# Patient Record
Sex: Male | Born: 1990 | Race: Black or African American | Hispanic: No | Marital: Married | Smoking: Former smoker
Health system: Southern US, Community
[De-identification: ages and names within clinical notes are randomized; demographics above are authoritative.]

## PROBLEM LIST (undated history)

## (undated) DIAGNOSIS — I10 Essential (primary) hypertension: Secondary | ICD-10-CM

## (undated) DIAGNOSIS — F1010 Alcohol abuse, uncomplicated: Secondary | ICD-10-CM

## (undated) HISTORY — PX: INGUINAL HERNIA REPAIR: SUR1180

---
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
## 2010-01-15 ENCOUNTER — Emergency Department (HOSPITAL_COMMUNITY): Admission: EM | Admit: 2010-01-15 | Discharge: 2010-01-15 | Payer: Self-pay | Admitting: Emergency Medicine

## 2010-05-10 ENCOUNTER — Emergency Department (HOSPITAL_COMMUNITY): Admission: EM | Admit: 2010-05-10 | Discharge: 2010-05-10 | Payer: Self-pay | Admitting: Emergency Medicine

## 2010-10-05 ENCOUNTER — Emergency Department (HOSPITAL_COMMUNITY)
Admission: EM | Admit: 2010-10-05 | Discharge: 2010-10-05 | Payer: Self-pay | Source: Home / Self Care | Admitting: Emergency Medicine

## 2010-10-09 LAB — GC/CHLAMYDIA PROBE AMP, GENITAL
Chlamydia, DNA Probe: NEGATIVE
GC Probe Amp, Genital: NEGATIVE

## 2011-08-15 ENCOUNTER — Emergency Department (HOSPITAL_COMMUNITY)
Admission: EM | Admit: 2011-08-15 | Discharge: 2011-08-15 | Disposition: A | Discharge status: Home / Self Care | Payer: BC Managed Care – PPO | Attending: Emergency Medicine | Admitting: Emergency Medicine

## 2011-08-15 ENCOUNTER — Encounter: Payer: Self-pay | Admitting: *Deleted

## 2011-08-15 DIAGNOSIS — H00039 Abscess of eyelid unspecified eye, unspecified eyelid: Secondary | ICD-10-CM | POA: Insufficient documentation

## 2011-08-15 DIAGNOSIS — F17200 Nicotine dependence, unspecified, uncomplicated: Secondary | ICD-10-CM | POA: Insufficient documentation

## 2011-08-15 DIAGNOSIS — L03213 Periorbital cellulitis: Secondary | ICD-10-CM

## 2011-08-15 DIAGNOSIS — H109 Unspecified conjunctivitis: Secondary | ICD-10-CM | POA: Insufficient documentation

## 2011-08-15 MED ORDER — AMOXICILLIN-POT CLAVULANATE 500-125 MG PO TABS: ORAL_TABLET | ORAL | Status: DC

## 2011-08-15 MED ORDER — GATIFLOXACIN 0.5 % OP SOLN
1.0000 [drp] | Freq: Four times a day (QID) | OPHTHALMIC | Status: DC
  Administered 2011-08-15: 1 [drp] via OPHTHALMIC
  Filled 2011-08-15 (×2): qty 2.5

## 2011-08-15 MED ORDER — DIPHENHYDRAMINE HCL 25 MG PO CAPS
25.0000 mg | ORAL_CAPSULE | Freq: Once | ORAL | Status: AC
  Administered 2011-08-15: 25 mg via ORAL
  Filled 2011-08-15: qty 1

## 2011-08-15 NOTE — ED Notes (Signed)
MD at bedside. 

## 2011-08-15 NOTE — ED Notes (Signed)
Right eye swollen, very red with drainage; family member states that pt dx with pink eye and has taking eye drops for it but eye has gotten worse since taking eye drops, pt seen Dr. Janna Arch this morning and was sent here for evaluation, pt c/o irritation and itching

## 2011-08-15 NOTE — ED Provider Notes (Signed)
History     CSN: [id number] Arrival date & time: 08/15/2011 11:06 AM   First MD Initiated Contact with Patient 08/15/11 1125      Chief Complaint  Patient presents with  . Conjunctivitis    (Consider location/radiation/quality/duration/timing/severity/associated sxs/prior treatment) HPI Comments: Patient c/o right eye pain, drainage and redness for 4 days.  Eye is "matted" shut in the mornings.  States he woke up yesterday and also noticed swelling of his right upper eyelid.  He was seen by his PMD earlier this week for same and given sulfa drops that he has been using as directed but states the symptoms seem to be worsening.  He denies facial pain, headache, decreased vision, fever or vomiting  Patient is a 20 y.o. male presenting with conjunctivitis. The history is provided by the patient and a parent.  Conjunctivitis  The current episode started 3 to 5 days ago. The onset was gradual. The problem occurs continuously. The problem has been gradually worsening. The problem is moderate. The symptoms are relieved by nothing. The symptoms are aggravated by light. Associated symptoms include eye itching, photophobia, congestion, rhinorrhea, swollen glands, eye discharge, eye pain and eye redness. Pertinent negatives include no orthopnea, no fever, no decreased vision, no vomiting, no ear discharge, no ear pain, no headaches, no hearing loss, no mouth sores, no sore throat, no stridor, no muscle aches, no neck pain, no neck stiffness, no cough, no URI, no wheezing and no rash. The eye pain is mild. There is pain in the right eye. The eye pain is not associated with movement. The eyelid exhibits swelling and redness. He has been behaving normally. He has been eating and drinking normally. There were no sick contacts. Recently, medical care has been given by the PCP.    History reviewed. No pertinent past medical history.  History reviewed. No pertinent past surgical history.  History reviewed. No  pertinent family history.  History  Substance Use Topics  . Smoking status: Current Some Day Smoker  . Smokeless tobacco: Not on file  . Alcohol Use: Yes     occasionally      Review of Systems  Constitutional: Negative for fever, chills and fatigue.  HENT: Positive for congestion and rhinorrhea. Negative for hearing loss, ear pain, sore throat, facial swelling, mouth sores, trouble swallowing, neck pain, neck stiffness, sinus pressure and ear discharge.   Eyes: Positive for photophobia, pain, discharge, redness and itching.  Respiratory: Negative for cough, shortness of breath, wheezing and stridor.   Cardiovascular: Positive for palpitations. Negative for chest pain and orthopnea.  Gastrointestinal: Negative for vomiting.  Genitourinary: Negative for dysuria, hematuria and flank pain.  Musculoskeletal: Negative for myalgias, back pain and arthralgias.  Skin: Negative for rash.  Neurological: Negative for dizziness, weakness, numbness and headaches.  Hematological: Positive for adenopathy. Does not bruise/bleed easily.    Allergies  Review of patient's allergies indicates no known allergies.  Home Medications  No current outpatient prescriptions on file.  BP 129/79  Pulse 65  Temp(Src) 97.5 F (36.4 C) (Oral)  Resp 18  Ht 5\' 8"  (1.727 m)  Wt 157 lb (71.215 kg)  BMI 23.87 kg/m2  SpO2 98%  Physical Exam  Nursing note and vitals reviewed. Constitutional: He is oriented to person, place, and time. He appears well-developed and well-nourished. No distress.  HENT:  Head: Normocephalic and atraumatic.  Mouth/Throat: Oropharynx is clear and moist.  Eyes: EOM are normal. Pupils are equal, round, and reactive to light. Right eye exhibits discharge  and exudate. Right eye exhibits no chemosis and no hordeolum. No foreign body present in the right eye. Left eye exhibits no chemosis, no discharge and no exudate. Right conjunctiva is injected. Right conjunctiva has no hemorrhage. No  scleral icterus. Right pupil is round and reactive. Pupils are equal.       Moderate STS of the upper right eyelid.  Mild erythema of the upper eyelid only.  No tenderness, no ptosis  Neck: Normal range of motion. Neck supple. No Brudzinski's sign and no Kernig's sign noted.  Cardiovascular: Normal rate, regular rhythm and normal heart sounds.   Pulmonary/Chest: Effort normal and breath sounds normal. No respiratory distress. He exhibits no tenderness.  Musculoskeletal: Normal range of motion. He exhibits no tenderness.  Lymphadenopathy:       Head (right side): Preauricular adenopathy present.    He has no cervical adenopathy.  Neurological: He is alert and oriented to person, place, and time. He displays normal reflexes. No cranial nerve deficit. He exhibits normal muscle tone. Coordination normal.  Skin: Skin is warm and dry.  Psychiatric: He has a normal mood and affect.    ED Course  Procedures (including critical care time)       MDM     1:30 PM patient remains stable.  Edema of the upper eyelid has improved during ED stay.  No increasing erythema.  Pt is non-toxic appearing.  Will have him d/c the sulfa eye drops and continue the gatifloxin and will also prescribe augmentin for possible early periorbital cellulitis.  Pt agrees to return here if sx's worsen.   Pt feels improved after observation and/or treatment in ED.   Patient / Family / Caregiver understand and agree with initial ED impression and plan with expectations set for ED visit.   Ahsan Esterline L. [place], [place] 08/17/11 2034

## 2011-08-15 NOTE — ED Notes (Signed)
Pt also wears contact lenses but has not used them Sunday in his right eye.

## 2011-08-15 NOTE — ED Notes (Addendum)
Pt c/o swelling, redness and drainage to his right eye since Sunday. Denies injury. Pt was seen by PCP on Monday and given eye drops. Pt states that it has gotten worse.

## 2011-08-18 NOTE — ED Provider Notes (Signed)
Medical screening examination/treatment/procedure(s) were performed by non-physician practitioner and as supervising physician I was immediately available for consultation/collaboration.   Dayton Bailiff, MD 08/18/11 [phone]

## 2012-04-29 ENCOUNTER — Encounter (HOSPITAL_COMMUNITY): Payer: Self-pay | Admitting: Emergency Medicine

## 2012-04-29 ENCOUNTER — Emergency Department (HOSPITAL_COMMUNITY)
Admission: EM | Admit: 2012-04-29 | Discharge: 2012-04-29 | Disposition: A | Discharge status: Home / Self Care | Payer: No Typology Code available for payment source | Attending: Emergency Medicine | Admitting: Emergency Medicine

## 2012-04-29 ENCOUNTER — Emergency Department (HOSPITAL_COMMUNITY): Payer: No Typology Code available for payment source

## 2012-04-29 DIAGNOSIS — R079 Chest pain, unspecified: Secondary | ICD-10-CM | POA: Insufficient documentation

## 2012-04-29 DIAGNOSIS — R51 Headache: Secondary | ICD-10-CM | POA: Insufficient documentation

## 2012-04-29 DIAGNOSIS — M545 Low back pain, unspecified: Secondary | ICD-10-CM | POA: Insufficient documentation

## 2012-04-29 DIAGNOSIS — F17200 Nicotine dependence, unspecified, uncomplicated: Secondary | ICD-10-CM | POA: Insufficient documentation

## 2012-04-29 MED ORDER — DIAZEPAM 5 MG PO TABS: 5.0000 mg | ORAL_TABLET | Freq: Two times a day (BID) | ORAL | Status: AC

## 2012-04-29 MED ORDER — IBUPROFEN 400 MG PO TABS
800.0000 mg | ORAL_TABLET | Freq: Once | ORAL | Status: AC
  Administered 2012-04-29: 800 mg via ORAL
  Filled 2012-04-29: qty 2

## 2012-04-29 MED ORDER — IBUPROFEN 800 MG PO TABS: 800.0000 mg | ORAL_TABLET | Freq: Three times a day (TID) | ORAL | Status: AC
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
## 2012-04-29 NOTE — ED Notes (Signed)
Pt d/c home in NAD. Pt able to ambulate with quick steady gait. Pt voiced understanding of d/c instructions and follow up care. Instructed not to drive after taking valium .
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
## 2012-05-02 NOTE — ED Provider Notes (Signed)
Medical screening examination/treatment/procedure(s) were performed by non-physician practitioner and as supervising physician I was immediately available for consultation/collaboration.   Charles B. Sheldon, MD 05/02/12 2152 

## 2012-07-10 ENCOUNTER — Encounter (HOSPITAL_COMMUNITY): Payer: Self-pay

## 2012-07-10 ENCOUNTER — Emergency Department (HOSPITAL_COMMUNITY): Payer: BC Managed Care – PPO

## 2012-07-10 ENCOUNTER — Emergency Department (HOSPITAL_COMMUNITY)
Admission: EM | Admit: 2012-07-10 | Discharge: 2012-07-10 | Disposition: A | Discharge status: Home / Self Care | Payer: BC Managed Care – PPO | Attending: Emergency Medicine | Admitting: Emergency Medicine

## 2012-07-10 DIAGNOSIS — Y929 Unspecified place or not applicable: Secondary | ICD-10-CM | POA: Insufficient documentation

## 2012-07-10 DIAGNOSIS — S61411A Laceration without foreign body of right hand, initial encounter: Secondary | ICD-10-CM

## 2012-07-10 DIAGNOSIS — S61409A Unspecified open wound of unspecified hand, initial encounter: Secondary | ICD-10-CM | POA: Insufficient documentation

## 2012-07-10 DIAGNOSIS — W230XXA Caught, crushed, jammed, or pinched between moving objects, initial encounter: Secondary | ICD-10-CM | POA: Insufficient documentation

## 2012-07-10 DIAGNOSIS — Y939 Activity, unspecified: Secondary | ICD-10-CM | POA: Insufficient documentation

## 2012-07-10 MED ORDER — CEPHALEXIN 500 MG PO CAPS: 500.0000 mg | ORAL_CAPSULE | Freq: Four times a day (QID) | ORAL | Status: DC

## 2012-07-10 MED ORDER — LIDOCAINE HCL (PF) 2 % IJ SOLN
2.0000 mL | Freq: Once | INTRAMUSCULAR | Status: AC
  Administered 2012-07-10: 2 mL
  Filled 2012-07-10: qty 10

## 2012-07-10 MED ORDER — HYDROCODONE-ACETAMINOPHEN 5-325 MG PO TABS: 1.0000 | ORAL_TABLET | ORAL | Status: AC | PRN

## 2012-07-10 NOTE — ED Provider Notes (Signed)
History     CSN: [id number]  Arrival date & time 07/10/12  1433   First MD Initiated Contact with Patient 07/10/12 1519      Chief Complaint  Patient presents with  . Hand Injury    (Consider location/radiation/quality/duration/timing/severity/associated sxs/prior treatment) HPI Comments: Jimmy Castro presents with laceration x 2 to his right dorsal hand which occurred approximately 7 hours before arrival.  He describes catching his hand in a closing sliding glass door causing 2 large flap lacerations.  He attempted to repair the injury with dressing and pressure after cleansing the wound with peroxide,  But the wound kept bleeding and wife insisted he come in for treatment.  He has moderate pain which is worse with movement of the 3rd finger and improves with rest and elevation.  He denies weakness in his hand or fingers.  He has taken no medications prior to arrival.   The history is provided by the patient.    History reviewed. No pertinent past medical history.  History reviewed. No pertinent past surgical history.  No family history on file.  History  Substance Use Topics  . Smoking status: Never Smoker   . Smokeless tobacco: Not on file  . Alcohol Use: No     occasionally      Review of Systems  Constitutional: Negative for fever and chills.  HENT: Negative for facial swelling.   Respiratory: Negative for shortness of breath and wheezing.   Skin: Positive for wound.  Neurological: Negative for numbness.    Allergies  Review of patient's allergies indicates no known allergies.  Home Medications   Current Outpatient Rx  Name Route Sig Dispense Refill  . CEPHALEXIN 500 MG PO CAPS Oral Take 1 capsule (500 mg total) by mouth 4 (four) times daily. 40 capsule 0  . HYDROCODONE-ACETAMINOPHEN 5-325 MG PO TABS Oral Take 1 tablet by mouth every 4 (four) hours as needed for pain. 20 tablet 0    BP 137/78  Pulse 77  Temp 98.1 F (36.7 C) (Oral)  Ht 5\' 8"  (1.727  m)  Wt 156 lb (70.761 kg)  BMI 23.72 kg/m2  SpO2 100%  Physical Exam  Constitutional: He is oriented to person, place, and time. He appears well-developed and well-nourished.  HENT:  Head: Normocephalic.  Cardiovascular: Normal rate.   Pulmonary/Chest: Effort normal.  Musculoskeletal: He exhibits tenderness.  Neurological: He is alert and oriented to person, place, and time. He has normal strength. No sensory deficit.       5/5 strength with resisted flexion and extension of fingers on right hand.  Skin: Laceration noted.       2 moderate sized flap lacerations at dorsal base of right third finger, subcutaneous,  Crossing 3rd mcp joint.  Distal sensation intact.  Hemostatic. Extensor tendon of third finger visualized, tendon sheath disrupted, appears abraded, tendon intact.    ED Course  Procedures (including critical care time)  Labs Reviewed - No data to display Dg Hand Complete Right  07/10/2012  *RADIOLOGY REPORT*  Clinical Data: Laceration to posterior third proximal interphalangeal joint.  RIGHT HAND - COMPLETE 3+ VIEW  Comparison: None.  Findings: Overlap of fingers on the lateral (technologist notes state the patient is unable to be appropriately positioned).  Given this factor, no acute fracture or dislocation. No radio-opaque foreign body.  No definite soft tissue swelling.  IMPRESSION: No acute osseous abnormality.  Mildly degraded lateral view.   Original Report Authenticated By: Consuello Bossier, M.D.  LACERATION REPAIR  - complicated repair with multiple flaps,  Irregular edges,  Debridement required. Performed by: Burgess Amor Authorized by: Burgess Amor Consent: Verbal consent obtained. Risks and benefits: risks, benefits and alternatives were discussed Consent given by: patient Patient identity confirmed: provided demographic data Prepped and Draped in normal sterile fashion Wound explored  Wound flushed copiously with NS after shur clens wound cleaner  applied.  Laceration Location: right hand  Laceration Length: 3 cm flap lac #1, 2 cm #2  No Foreign Bodies seen or palpated  Anesthesia: local infiltration  Local anesthetic: lidocaine 2% without epinephrine  Anesthetic total: 4 ml  Irrigation method: syringe Amount of cleaning: standard  Skin closure: 4-0 ethilon  Number of sutures: #1 - 7,  #2 - 5  Technique: simple interrupted Patient tolerance: Patient tolerated the procedure well with no immediate complications.    1. Laceration of right hand       MDM  Pt s wounds dressed,  Bulky dressing, finger splint to prevent bending at mcp.  Pt prescribed keflex given old nature of wound.  Hydrocodone prn pain.  Discussed worsened sx that should prompt return here.  Referral to Dr. Romeo Apple for a recheck in 1 week otherwise to reassess finger strength/ tendon healing.  Pt agreeable with plan.  Pt is utd on tetanus.       Burgess Amor, PA 07/10/12 2141  Burgess Amor, PA 07/10/12 2142
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
## 2012-07-10 NOTE — ED Notes (Signed)
PA repaired lac.

## 2012-07-12 NOTE — ED Provider Notes (Signed)
Medical screening examination/treatment/procedure(s) were performed by non-physician practitioner and as supervising physician I was immediately available for consultation/collaboration.   Laray Anger, DO 07/12/12 1626

## 2013-08-21 ENCOUNTER — Encounter (HOSPITAL_COMMUNITY): Payer: Self-pay | Admitting: Emergency Medicine

## 2013-08-21 ENCOUNTER — Emergency Department (HOSPITAL_COMMUNITY)
Admission: EM | Admit: 2013-08-21 | Discharge: 2013-08-21 | Disposition: A | Discharge status: Home / Self Care | Payer: BC Managed Care – PPO | Attending: Emergency Medicine | Admitting: Emergency Medicine

## 2013-08-21 DIAGNOSIS — R197 Diarrhea, unspecified: Secondary | ICD-10-CM | POA: Insufficient documentation

## 2013-08-21 DIAGNOSIS — R6889 Other general symptoms and signs: Secondary | ICD-10-CM | POA: Insufficient documentation

## 2013-08-21 DIAGNOSIS — R05 Cough: Secondary | ICD-10-CM | POA: Insufficient documentation

## 2013-08-21 DIAGNOSIS — R42 Dizziness and giddiness: Secondary | ICD-10-CM | POA: Insufficient documentation

## 2013-08-21 DIAGNOSIS — R059 Cough, unspecified: Secondary | ICD-10-CM | POA: Insufficient documentation

## 2013-08-21 DIAGNOSIS — R109 Unspecified abdominal pain: Secondary | ICD-10-CM | POA: Insufficient documentation

## 2013-08-21 DIAGNOSIS — R112 Nausea with vomiting, unspecified: Secondary | ICD-10-CM | POA: Insufficient documentation

## 2013-08-21 DIAGNOSIS — E860 Dehydration: Secondary | ICD-10-CM

## 2013-08-21 LAB — POCT I-STAT, CHEM 8
BUN: 4 mg/dL — ABNORMAL LOW (ref 6–23)
Calcium, Ion: 1.21 mmol/L (ref 1.12–1.23)
Chloride: 103 mEq/L (ref 96–112)
Creatinine, Ser: 1.1 mg/dL (ref 0.50–1.35)
Glucose, Bld: 92 mg/dL (ref 70–99)
HCT: 53 % — ABNORMAL HIGH (ref 39.0–52.0)
Hemoglobin: 18 g/dL — ABNORMAL HIGH (ref 13.0–17.0)
Potassium: 4.3 mEq/L (ref 3.5–5.1)
Sodium: 139 mEq/L (ref 135–145)
TCO2: 23 mmol/L (ref 0–100)

## 2013-08-21 MED ORDER — ONDANSETRON 4 MG PO TBDP: 4.0000 mg | ORAL_TABLET | Freq: Three times a day (TID) | ORAL | Status: DC | PRN

## 2013-08-21 MED ORDER — SODIUM CHLORIDE 0.9 % IV SOLN: 1000.0000 mL | INTRAVENOUS | Status: DC

## 2013-08-21 MED ORDER — SODIUM CHLORIDE 0.9 % IV SOLN
1000.0000 mL | Freq: Once | INTRAVENOUS | Status: AC
  Administered 2013-08-21: 1000 mL via INTRAVENOUS

## 2013-08-21 MED ORDER — ONDANSETRON HCL 4 MG/2ML IJ SOLN
4.0000 mg | Freq: Once | INTRAMUSCULAR | Status: AC
  Administered 2013-08-21: 4 mg via INTRAVENOUS
  Filled 2013-08-21: qty 2

## 2013-08-21 NOTE — ED Provider Notes (Signed)
CSN: [id number]     Arrival date & time 08/21/13  1236 History  This chart was scribed for Ward Givens, MD by Bennett Scrape, ED Scribe. This patient was seen in room [facility] and the patient's care was started at 1:15 PM.   Chief Complaint  Patient presents with  . Emesis    The history is provided by the patient. No language interpreter was used.    HPI Comments: Jimmy Castro is a 22 y.o. male who presents to the Emergency Department complaining of persistent nausea with associated non-bloody emesis, lower abdominal cramping, intermittent dizziness upon standing, nonproductive cough, sneezing  and non-bloody diarrhea described as watery that started yesterday morning. He reports 2 to 3 episodes of vomiting and 4 to 5 episodes of diarrhea since onset. He admits that he has been able to sip water but otherwise liquids and foods aggravate the symptoms. He denies any sick contacts with similar symptoms. He denies any fever, weakness or sore throat. He has mild dizziness at times.    He is an occasional smoker stating that 1 pack will last 2 weeks. He admits that he is a occasional alcohol user, last drink was 3-4 days ago.  PCP none  History reviewed. No pertinent past medical history. History reviewed. No pertinent past surgical history. No family history on file. History  Substance Use Topics  . Smoking status: Never Smoker   . Smokeless tobacco: Not on file  . Alcohol Use: No     Comment: occasionally  He is a Production designer, theatre/television/film Smokes 1 pp2 weeks occasional ETOH, but not the night before he got ill.   Review of Systems  Constitutional: Negative for fever.  HENT: Positive for sneezing. Negative for sore throat.   Respiratory: Positive for cough. Negative for shortness of breath.   Gastrointestinal: Positive for nausea, vomiting, abdominal pain and diarrhea. Negative for blood in stool.  Neurological: Positive for dizziness. Negative for weakness.  All other systems  reviewed and are negative.    Allergies  Review of patient's allergies indicates no known allergies.  Home Medications   Current Outpatient Rx  Name  Route  Sig  Dispense  Refill  . ondansetron (ZOFRAN ODT) 4 MG disintegrating tablet   Oral   Take 1 tablet (4 mg total) by mouth every 8 (eight) hours as needed for nausea or vomiting.   6 tablet   0     Triage Vitals: BP 150/99  Pulse 88  Temp(Src) 97.4 F (36.3 C) (Oral)  Resp 20  Ht 5' 7.5" (1.715 m)  Wt 148 lb (67.132 kg)  BMI 22.82 kg/m2  SpO2 100%  Vital signs normal   Orthostatic vital signs are normal   Physical Exam  Nursing note and vitals reviewed. Constitutional: He is oriented to person, place, and time. He appears well-developed and well-nourished.  Non-toxic appearance. He does not appear ill. No distress.  HENT:  Head: Normocephalic and atraumatic.  Right Ear: External ear normal.  Left Ear: External ear normal.  Nose: Nose normal. No mucosal edema or rhinorrhea.  Mouth/Throat: Oropharynx is clear and moist. No dental abscesses or uvula swelling.  Mildly dry tongue  Eyes: Conjunctivae and EOM are normal. Pupils are equal, round, and reactive to light.  Neck: Normal range of motion and full passive range of motion without pain. Neck supple.  Cardiovascular: Normal rate, regular rhythm and normal heart sounds.  Exam reveals no gallop and no friction rub.   No murmur heard. Pulmonary/Chest:  Effort normal and breath sounds normal. No respiratory distress. He has no wheezes. He has no rhonchi. He has no rales. He exhibits no tenderness and no crepitus.  Abdominal: Soft. Normal appearance. He exhibits no distension. Bowel sounds are increased. There is tenderness. There is no rebound and no guarding.  Mild discomfort in the LLQ  Musculoskeletal: Normal range of motion. He exhibits no edema and no tenderness.  Moves all extremities well.   Neurological: He is alert and oriented to person, place, and time. He  has normal strength. No cranial nerve deficit.  Skin: Skin is warm, dry and intact. No rash noted. No erythema. No pallor.  Psychiatric: He has a normal mood and affect. His speech is normal and behavior is normal. His mood appears not anxious.    ED Course  Procedures (including critical care time)  Medications  0.9 %  sodium chloride infusion (0 mLs Intravenous Stopped 08/21/13 1417)    Followed by  0.9 %  sodium chloride infusion (1,000 mLs Intravenous New Bag/Given 08/21/13 1415)    Followed by  0.9 %  sodium chloride infusion (not administered)  ondansetron (ZOFRAN) injection 4 mg (4 mg Intravenous Given 08/21/13 1329)    DIAGNOSTIC STUDIES: Oxygen Saturation is 100% on room air, normal by my interpretation.    COORDINATION OF CARE: 1:20 PM-Discussed treatment plan which includes IV fluids and antiemetic with pt at bedside and pt agreed to plan.   2:24 PM-Pt rechecked and feels improved with medications listed above. He reports that he drank fluids with no difficulty. Denies that he has to urinate currently. Reviewed blood work with pt. Discussed discharge plan when the second IV fluid is administered. Will provide antiemetic and advised pt to get imodium for diarrhea. No milk or solid foods until diarrhea is resolved. Will provide a work note for the pt for tonight.   3:01 PM-Pt rechecked and has urinated. He is feeling better. Will discharge with plan as stated above.    Results for orders placed during the hospital encounter of 08/21/13  POCT I-STAT, CHEM 8      Result Value Range   Sodium 139  135 - 145 mEq/L   Potassium 4.3  3.5 - 5.1 mEq/L   Chloride 103  96 - 112 mEq/L   BUN 4 (*) 6 - 23 mg/dL   Creatinine, Ser 1.47  0.50 - 1.35 mg/dL   Glucose, Bld 92  70 - 99 mg/dL   Calcium, Ion 8.29  5.62 - 1.23 mmol/L   TCO2 23  0 - 100 mmol/L   Hemoglobin 18.0 (*) 13.0 - 17.0 g/dL   HCT 13.0 (*) 86.5 - 78.4 %   Laboratory interpretation all normal except concentrated Hb c/w  dehydration    EKG Interpretation   None       MDM   1. Nausea vomiting and diarrhea   2. Dehydration    New Prescriptions   ONDANSETRON (ZOFRAN ODT) 4 MG DISINTEGRATING TABLET    Take 1 tablet (4 mg total) by mouth every 8 (eight) hours as needed for nausea or vomiting.    Plan discharge   Devoria Albe, MD, FACEP   I personally performed the services described in this documentation, which was scribed in my presence. The recorded information has been reviewed and considered.      Ward Givens, MD 08/21/13 [phone]

## 2013-08-21 NOTE — ED Notes (Signed)
Pt c/o n/v/d, fever that started two days ago, states that the n/v is better but continues to feel weak and dizzy.

## 2015-05-27 ENCOUNTER — Emergency Department (HOSPITAL_COMMUNITY)
Admission: EM | Admit: 2015-05-27 | Discharge: 2015-05-27 | Discharge status: Absent without leave | Payer: No Typology Code available for payment source | Attending: Emergency Medicine | Admitting: Emergency Medicine

## 2015-12-29 ENCOUNTER — Other Ambulatory Visit: Payer: Self-pay | Admitting: Occupational Medicine

## 2015-12-29 ENCOUNTER — Ambulatory Visit: Payer: Self-pay

## 2015-12-29 DIAGNOSIS — Z0000 Encounter for general adult medical examination without abnormal findings: Secondary | ICD-10-CM

## 2016-08-01 ENCOUNTER — Emergency Department (HOSPITAL_COMMUNITY)
Admission: EM | Admit: 2016-08-01 | Discharge: 2016-08-01 | Disposition: A | Discharge status: Home / Self Care | Payer: Self-pay | Attending: Emergency Medicine | Admitting: Emergency Medicine

## 2016-08-01 ENCOUNTER — Encounter (HOSPITAL_COMMUNITY): Payer: Self-pay | Admitting: Emergency Medicine

## 2016-08-01 DIAGNOSIS — I10 Essential (primary) hypertension: Secondary | ICD-10-CM | POA: Insufficient documentation

## 2016-08-01 DIAGNOSIS — F1010 Alcohol abuse, uncomplicated: Secondary | ICD-10-CM | POA: Insufficient documentation

## 2016-08-01 DIAGNOSIS — Z79899 Other long term (current) drug therapy: Secondary | ICD-10-CM | POA: Insufficient documentation

## 2016-08-01 DIAGNOSIS — F17210 Nicotine dependence, cigarettes, uncomplicated: Secondary | ICD-10-CM | POA: Insufficient documentation

## 2016-08-01 HISTORY — DX: Essential (primary) hypertension: I10

## 2016-08-01 LAB — CBC WITH DIFFERENTIAL/PLATELET
Basophils Absolute: 0 10*3/uL (ref 0.0–0.1)
Basophils Relative: 0 %
EOS ABS: 0 10*3/uL (ref 0.0–0.7)
Eosinophils Relative: 0 %
HCT: 42.7 % (ref 39.0–52.0)
HEMOGLOBIN: 16.2 g/dL (ref 13.0–17.0)
LYMPHS ABS: 1.6 10*3/uL (ref 0.7–4.0)
Lymphocytes Relative: 14 %
MCH: 35.4 pg — ABNORMAL HIGH (ref 26.0–34.0)
MCHC: 37.9 g/dL — ABNORMAL HIGH (ref 30.0–36.0)
MCV: 93.2 fL (ref 78.0–100.0)
MONO ABS: 0.7 10*3/uL (ref 0.1–1.0)
Monocytes Relative: 6 %
NEUTROS PCT: 80 %
Neutro Abs: 9.2 10*3/uL — ABNORMAL HIGH (ref 1.7–7.7)
PLATELETS: 179 10*3/uL (ref 150–400)
RBC: 4.58 MIL/uL (ref 4.22–5.81)
RDW: 14 % (ref 11.5–15.5)
WBC: 11.5 10*3/uL — AB (ref 4.0–10.5)

## 2016-08-01 LAB — COMPREHENSIVE METABOLIC PANEL
ALBUMIN: 4.8 g/dL (ref 3.5–5.0)
ALK PHOS: 145 U/L — AB (ref 38–126)
ALT: 78 U/L — AB (ref 17–63)
ANION GAP: 10 (ref 5–15)
AST: 90 U/L — ABNORMAL HIGH (ref 15–41)
BUN: 5 mg/dL — ABNORMAL LOW (ref 6–20)
CALCIUM: 9.5 mg/dL (ref 8.9–10.3)
CHLORIDE: 98 mmol/L — AB (ref 101–111)
CO2: 27 mmol/L (ref 22–32)
Creatinine, Ser: 0.69 mg/dL (ref 0.61–1.24)
GFR calc Af Amer: >60 mL/min (ref >60)
GFR calc non Af Amer: >60 mL/min (ref >60)
GLUCOSE: 90 mg/dL (ref 65–99)
Potassium: 3.2 mmol/L — ABNORMAL LOW (ref 3.5–5.1)
SODIUM: 135 mmol/L (ref 135–145)
Total Bilirubin: 1 mg/dL (ref 0.3–1.2)
Total Protein: 8.3 g/dL — ABNORMAL HIGH (ref 6.5–8.1)

## 2016-08-01 LAB — ETHANOL: Alcohol, Ethyl (B): 7 mg/dL — ABNORMAL HIGH (ref <5)

## 2016-08-01 MED ORDER — LORAZEPAM 1 MG PO TABS
1.0000 mg | ORAL_TABLET | Freq: Once | ORAL | Status: AC
  Administered 2016-08-01: 1 mg via ORAL
  Filled 2016-08-01: qty 1

## 2016-08-01 MED ORDER — CHLORDIAZEPOXIDE HCL 25 MG PO CAPS: ORAL_CAPSULE | ORAL | 0 refills | Status: DC

## 2016-08-01 NOTE — Discharge Instructions (Signed)
Follow up with [facility] tomorrow.  °

## 2016-08-01 NOTE — ED Notes (Signed)
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 Pt wants detox.  Pt denies si/hi.  Pt denies any drug use. Last drink was yesterday.   MD at bedside.

## 2016-08-01 NOTE — ED Provider Notes (Signed)
[facility]-EMERGENCY DEPT Provider Note   CSN: [id number] Arrival date & time: 08/01/16  1226     History   [id number] Complaint Chief Complaint  Patient presents with  . V70.1    HPI Jimmy Castro is a 25 y.o. male.  Patient states he wants some help for his alcohol abuse. He drinks considerable amount of clear    Alcohol Problem  This is a new problem. The current episode started 1 to 2 hours ago. The problem occurs constantly. The problem has not changed since onset.Pertinent negatives include no chest pain, no abdominal pain and no headaches. Nothing aggravates the symptoms. Nothing relieves the symptoms. He has tried nothing for the symptoms.    Past Medical History:  Diagnosis Date  . Hypertension     There are no active problems to display for this patient.   History reviewed. No pertinent surgical history.     Home Medications    Prior to Admission medications   Medication Sig Start Date End Date Taking? Authorizing Provider  guaiFENesin (MUCINEX) 600 MG 12 hr tablet Take 600 mg by mouth 2 (two) times daily as needed for to loosen phlegm.   Yes Historical Provider, MD  Omega-3 Fatty Acids (FISH OIL PO) Take 1 capsule by mouth daily.   Yes Historical Provider, MD  Pediatric Multiple Vit-C-FA (FLINSTONES GUMMIES OMEGA-3 DHA) CHEW Chew 3-4 tablets by mouth daily.   Yes Historical Provider, MD  Riboflavin (VITAMIN B-2 PO) Take 1 tablet by mouth daily.   Yes Historical Provider, MD  chlordiazePOXIDE (LIBRIUM) 25 MG capsule 50mg  PO TID x 1D, then 25-50mg  PO BID X 1D, then 25-50mg  PO QD X 1D 08/01/16   Bethann BerkshireJoseph Nickalas Mccarrick, MD  ondansetron (ZOFRAN ODT) 4 MG disintegrating tablet Take 1 tablet (4 mg total) by mouth every 8 (eight) hours as needed for nausea or vomiting. 08/21/13   Devoria AlbeIva Knapp, MD    Family History History reviewed. No pertinent family history.  Social History Social History  Substance Use Topics  . Smoking status: Current Every Day Smoker    Types: Cigarettes    . Smokeless tobacco: Never Used  . Alcohol use Yes     Comment: daily     Allergies   Patient has no known allergies.   Review of Systems Review of Systems  Constitutional: Negative for appetite change and fatigue.  HENT: Negative for congestion, ear discharge and sinus pressure.   Eyes: Negative for discharge.  Respiratory: Negative for cough.   Cardiovascular: Negative for chest pain.  Gastrointestinal: Negative for abdominal pain and diarrhea.  Genitourinary: Negative for frequency and hematuria.  Musculoskeletal: Negative for back pain.  Skin: Negative for rash.  Neurological: Negative for seizures and headaches.  Psychiatric/Behavioral: Negative for hallucinations.     Physical Exam Updated Vital Signs BP (!) 144/109   Pulse 85   Temp 97.7 F (36.5 C) (Oral)   Resp 16   Ht 5\' 7"  (1.702 m)   Wt 135 lb (61.2 kg)   SpO2 98%   BMI 21.14 kg/m   Physical Exam  Constitutional: He is oriented to person, place, and time. He appears well-developed.  HENT:  Head: Normocephalic.  Eyes: Conjunctivae and EOM are normal. No scleral icterus.  Neck: Neck supple. No thyromegaly present.  Cardiovascular: Normal rate and regular rhythm.  Exam reveals no gallop and no friction rub.   No murmur heard. Pulmonary/Chest: No stridor. He has no wheezes. He has no rales. He exhibits no tenderness.  Abdominal: He exhibits  no distension. There is no tenderness. There is no rebound.  Musculoskeletal: Normal range of motion. He exhibits no edema.  Lymphadenopathy:    He has no cervical adenopathy.  Neurological: He is oriented to person, place, and time. He exhibits normal muscle tone. Coordination normal.  Skin: No rash noted. No erythema.  Psychiatric: He has a normal mood and affect. His behavior is normal.     ED Treatments / Results  Labs (all labs ordered are listed, but only abnormal results are displayed) Labs Reviewed  CBC WITH DIFFERENTIAL/PLATELET - Abnormal; Notable  for the following:       Result Value   WBC 11.5 (*)    MCH 35.4 (*)    MCHC 37.9 (*)    Neutro Abs 9.2 (*)    All other components within normal limits  COMPREHENSIVE METABOLIC PANEL - Abnormal; Notable for the following:    Potassium 3.2 (*)    Chloride 98 (*)    BUN 5 (*)    Total Protein 8.3 (*)    AST 90 (*)    ALT 78 (*)    Alkaline Phosphatase 145 (*)    All other components within normal limits  ETHANOL - Abnormal; Notable for the following:    Alcohol, Ethyl (B) 7 (*)    All other components within normal limits    EKG  EKG Interpretation None       Radiology No results found.  Procedures Procedures (including critical care time)  Medications Ordered in ED Medications  LORazepam (ATIVAN) tablet 1 mg (1 mg Oral Given 08/01/16 1327)     Initial Impression / Assessment and Plan / ED Course  I have reviewed the triage vital signs and the nursing notes.  Pertinent labs & imaging results that were available during my care of the patient were reviewed by me and considered in my medical decision making (see chart for details).  Clinical Course     Patient with alcohol abuse. He is given a prescription for Librium and [facility]  Final Clinical Impressions(s) / ED Diagnoses   Final diagnoses:  None    New Prescriptions New Prescriptions   CHLORDIAZEPOXIDE (LIBRIUM) 25 MG CAPSULE    50mg  PO TID x 1D, then 25-50mg  PO BID X 1D, then 25-50mg  PO QD X 1D     Bethann BerkshireJoseph Cataldo Cosgriff, MD 08/01/16 [phone]

## 2016-08-01 NOTE — ED Triage Notes (Signed)
Pt reports he wants help detoxing from alcohol.  States he drinks daily pretty much all day and last drank last night.

## 2016-08-01 NOTE — ED Notes (Signed)
Pt reports symptoms are better after ativan administration.

## 2016-09-25 ENCOUNTER — Ambulatory Visit (HOSPITAL_COMMUNITY)
Admission: RE | Admit: 2016-09-25 | Discharge: 2016-09-25 | Disposition: A | Discharge status: Home / Self Care | Payer: Self-pay | Attending: Psychiatry | Admitting: Psychiatry

## 2016-09-25 DIAGNOSIS — I10 Essential (primary) hypertension: Secondary | ICD-10-CM | POA: Insufficient documentation

## 2016-09-25 DIAGNOSIS — F17210 Nicotine dependence, cigarettes, uncomplicated: Secondary | ICD-10-CM | POA: Insufficient documentation

## 2016-09-25 DIAGNOSIS — R61 Generalized hyperhidrosis: Secondary | ICD-10-CM | POA: Insufficient documentation

## 2016-09-25 DIAGNOSIS — R454 Irritability and anger: Secondary | ICD-10-CM | POA: Insufficient documentation

## 2016-09-25 DIAGNOSIS — R197 Diarrhea, unspecified: Secondary | ICD-10-CM | POA: Insufficient documentation

## 2016-09-25 DIAGNOSIS — R110 Nausea: Secondary | ICD-10-CM | POA: Insufficient documentation

## 2016-09-25 DIAGNOSIS — F10239 Alcohol dependence with withdrawal, unspecified: Secondary | ICD-10-CM | POA: Insufficient documentation

## 2016-09-25 NOTE — H&P (Signed)
Behavioral Health Medical Screening Exam  Jimmy Castro is an 26 y.o. male.  Total Time spent with patient: 20 minutes  Psychiatric Specialty Exam: Physical Exam  ROS denies headache, denies chest pain, no shortness of breath, no abdominal distress or pain,  no vomiting , no chills   There were no vitals taken for this visit.There is no height or weight on file to calculate BMI. Vitals  Temp.99.3, pulse 100, BP 137/93, SA02 100 %   General Appearance: Well Groomed  Eye Contact:  Good  Speech:  Normal Rate  Volume:  Normal  Mood:  denies depression, describes mood is "OK"  Affect:  Appropriate and smiles at times appropriately   Thought Process:  Linear  Orientation:  Full (Time, Place, and Person)- fully alert and attentive  Thought Content:  no hallucinations, no delusions, not internally preoccupied  Suicidal Thoughts:  No denies any suicidal or self injurious ideations  Homicidal Thoughts:  No denies any homicidal or violent ideations  Memory:  recent and remote grossly intact   Judgement:  Other:  present  Insight:  Present  Psychomotor Activity:  no psychomotor restlessness or agitation   Concentration: Concentration: Good and Attention Span: Good  Recall:  Good  Fund of Knowledge:Good  Language: Good  Akathisia:  Negative  Handed:  Right  AIMS (if indicated):     Assets:  Communication Skills Desire for Improvement Resilience  Sleep:       Musculoskeletal: Strength & Muscle Tone: within normal limits Gait & Station: normal Patient leans: N/A CP- rhythmic, no murmurs Lungs clear, symmetric Abdomen- soft- (+) peristaltic sounds Extremities- no edema  There were no vitals taken for this visit.  Recommendations:  Based on my evaluation the patient does not appear to have an emergency medical condition.  Nehemiah MassedOBOS, Loraine Freid, MD 09/25/2016, 6:14 PM

## 2016-09-25 NOTE — BH Assessment (Signed)
Tele Assessment Note   Jimmy Castro is an 26 y.o. male. Pt denies SI/HI and AVH. Pt reports severe alcohol use. Pt states he drinks 2 or more beers a day. Pt states he is experiencing withdrawal symptoms. Pt states he is sweating, has diarrehea, increased irritability, and nausea. Pt denies previous or current SA treatment. Pt denies previous or current mental health treatment. Pt denies previous abuse.   Per Fredna Dow, NP Pt does not meet inpatient criteria. Provided with inpatient SA programs.   Diagnosis: F10.20 Alcohol use, severe   Past Medical History:  Past Medical History:  Diagnosis Date  . Hypertension     No past surgical history on file.  Family History: No family history on file.  Social History:  reports that he has been smoking Cigarettes.  He has never used smokeless tobacco. He reports that he drinks alcohol. He reports that he does not use drugs.  Additional Social History:  Alcohol / Drug Use Pain Medications: Pt denies Prescriptions: Pt denies Over the Counter: Pt denies History of alcohol / drug use?: Yes Longest period of sobriety (when/how long): NA Withdrawal Symptoms: Agitation, Diarrhea, Weakness, Irritability, Sweats Substance #1 Name of Substance 1: Alcohol 1 - Age of First Use: unknown 1 - Amount (size/oz): 2 beers 1 - Frequency: daily 1 - Duration: ongoing 1 - Last Use / Amount: 09/24/16  CIWA:   COWS:    PATIENT STRENGTHS: (choose at least two) Average or above average intelligence Communication skills  Allergies: No Known Allergies  Home Medications:  (Not in a hospital admission)  OB/GYN Status:  No LMP for male patient.  General Assessment Data Location of Assessment: [facility] Assessment: In system Is this a Tele or Face-to-Face Assessment?: Face-to-Face Is this an Initial Assessment or a Re-assessment for this encounter?: Initial Assessment Marital status: Married Jimmy Castro name: NA Is patient pregnant?:  No Pregnancy Status: No Living Arrangements: Spouse/significant other Can pt return to current living arrangement?: Yes Admission Status: Voluntary Is patient capable of signing voluntary admission?: Yes Referral Source: Self/Family/Friend Insurance type: SP  Medical Screening Exam [facility] Walk-in ONLY) Medical Exam completed: Yes (completed by Dr. Jama Flavors)  Crisis Care Plan Living Arrangements: Spouse/significant other Legal Guardian: Other: Name of Psychiatrist: NA Name of Therapist: NA  Education Status Is patient currently in school?: No Current Grade: 12 Highest grade of school patient has completed: NA Name of school: NA Contact person: NA  Risk to self with the past 6 months Suicidal Ideation: No Has patient been a risk to self within the past 6 months prior to admission? : No Suicidal Intent: No Has patient had any suicidal intent within the past 6 months prior to admission? : No Is patient at risk for suicide?: No Suicidal Plan?: No Has patient had any suicidal plan within the past 6 months prior to admission? : No Access to Means: No What has been your use of drugs/alcohol within the last 12 months?: alcohol Previous Attempts/Gestures: No How many times?: 0 Other Self Harm Risks: NA Triggers for Past Attempts: None known Intentional Self Injurious Behavior: None Family Suicide History: No Recent stressful life event(s): Other (Comment) Persecutory voices/beliefs?: No Depression: Yes Depression Symptoms: Fatigue, Guilt, Tearfulness Substance abuse history and/or treatment for substance abuse?: No Suicide prevention information given to non-admitted patients: Not applicable  Risk to Others within the past 6 months Homicidal Ideation: No Does patient have any lifetime risk of violence toward others beyond the six months prior to admission? :  No Thoughts of Harm to Others: No Current Homicidal Intent: No Current Homicidal Plan: No Access to Homicidal Means:  No Identified Victim: NA History of harm to others?: No Assessment of Violence: None Noted Violent Behavior Description: NA Does patient have access to weapons?: No Criminal Charges Pending?: No Does patient have a court date: No Is patient on probation?: No  Psychosis Hallucinations: None noted Delusions: None noted  Mental Status Report Appearance/Hygiene: Unremarkable Eye Contact: Fair Motor Activity: Freedom of movement Speech: Logical/coherent Level of Consciousness: Alert Mood: Depressed Affect: Depressed Anxiety Level: None Thought Processes: Coherent, Relevant Judgement: Unimpaired Orientation: Person, Place, Time, Situation, Appropriate for developmental age Obsessive Compulsive Thoughts/Behaviors: None  Cognitive Functioning Concentration: Normal Memory: Recent Intact, Remote Intact IQ: Average Insight: Poor Impulse Control: Poor Appetite: Fair Weight Loss: 0 Weight Gain: 0 Sleep: Decreased Total Hours of Sleep: 5 Vegetative Symptoms: None  ADLScreening [facility]) Patient's cognitive ability adequate to safely complete daily activities?: Yes Patient able to express need for assistance with ADLs?: Yes Independently performs ADLs?: Yes (appropriate for developmental age)  Prior Inpatient Therapy Prior Inpatient Therapy: No Prior Therapy Dates: NA Prior Therapy Facilty/Provider(s): NA Reason for Treatment: NA  Prior Outpatient Therapy Prior Outpatient Therapy: No Prior Therapy Dates: NA Prior Therapy Facilty/Provider(s): NA Reason for Treatment: NA Does patient have an ACCT team?: No Does patient have Intensive In-House Services?  : No Does patient have [facility] services? : No Does patient have P4CC services?: No  ADL Screening (condition at time of admission) Patient's cognitive ability adequate to safely complete daily activities?: Yes Is the patient deaf or have difficulty hearing?: No Does the patient have difficulty seeing,  even when wearing glasses/contacts?: No Does the patient have difficulty concentrating, remembering, or making decisions?: No Patient able to express need for assistance with ADLs?: Yes Does the patient have difficulty dressing or bathing?: No Independently performs ADLs?: Yes (appropriate for developmental age) Does the patient have difficulty walking or climbing stairs?: No Weakness of Legs: None Weakness of Arms/Hands: None       Abuse/Neglect Assessment (Assessment to be complete while patient is alone) Physical Abuse: Denies Verbal Abuse: Denies Sexual Abuse: Denies Exploitation of patient/patient's resources: Denies Self-Neglect: Denies     Merchant navy officerAdvance Directives (For Healthcare) Does Patient Have a Medical Advance Directive?: No    Additional Information 1:1 In Past 12 Months?: No CIRT Risk: No Elopement Risk: No Does patient have medical clearance?: Yes     Disposition:  Disposition Initial Assessment Completed for this Encounter: Yes Disposition of Patient: Outpatient treatment Type of outpatient treatment: Adult  Tullio Chausse D 09/25/2016 6:42 PM

## 2016-12-07 ENCOUNTER — Emergency Department (HOSPITAL_COMMUNITY)
Admission: EM | Admit: 2016-12-07 | Discharge: 2016-12-07 | Disposition: A | Discharge status: Home / Self Care | Payer: Self-pay | Attending: Emergency Medicine | Admitting: Emergency Medicine

## 2016-12-07 ENCOUNTER — Encounter (HOSPITAL_COMMUNITY): Payer: Self-pay | Admitting: Emergency Medicine

## 2016-12-07 DIAGNOSIS — F17210 Nicotine dependence, cigarettes, uncomplicated: Secondary | ICD-10-CM | POA: Insufficient documentation

## 2016-12-07 DIAGNOSIS — F1010 Alcohol abuse, uncomplicated: Secondary | ICD-10-CM | POA: Insufficient documentation

## 2016-12-07 DIAGNOSIS — Z79899 Other long term (current) drug therapy: Secondary | ICD-10-CM | POA: Insufficient documentation

## 2016-12-07 DIAGNOSIS — I10 Essential (primary) hypertension: Secondary | ICD-10-CM | POA: Insufficient documentation

## 2016-12-07 LAB — RAPID URINE DRUG SCREEN, HOSP PERFORMED
Amphetamines: NOT DETECTED
BARBITURATES: NOT DETECTED
Benzodiazepines: NOT DETECTED
COCAINE: NOT DETECTED
Opiates: NOT DETECTED
TETRAHYDROCANNABINOL: NOT DETECTED

## 2016-12-07 LAB — CBC WITH DIFFERENTIAL/PLATELET
Basophils Absolute: 0.1 10*3/uL (ref 0.0–0.1)
Basophils Relative: 1 %
Eosinophils Absolute: 0 10*3/uL (ref 0.0–0.7)
Eosinophils Relative: 0 %
HEMATOCRIT: 43.5 % (ref 39.0–52.0)
HEMOGLOBIN: 16.7 g/dL (ref 13.0–17.0)
LYMPHS ABS: 1.5 10*3/uL (ref 0.7–4.0)
Lymphocytes Relative: 26 %
MCH: 34.4 pg — ABNORMAL HIGH (ref 26.0–34.0)
MCHC: 38.4 g/dL — AB (ref 30.0–36.0)
MCV: 89.5 fL (ref 78.0–100.0)
MONO ABS: 0.4 10*3/uL (ref 0.1–1.0)
MONOS PCT: 7 %
NEUTROS ABS: 3.6 10*3/uL (ref 1.7–7.7)
NEUTROS PCT: 65 %
Platelets: 104 10*3/uL — ABNORMAL LOW (ref 150–400)
RBC: 4.86 MIL/uL (ref 4.22–5.81)
RDW: 13.7 % (ref 11.5–15.5)
WBC: 5.6 10*3/uL (ref 4.0–10.5)

## 2016-12-07 LAB — BASIC METABOLIC PANEL
ANION GAP: 11 (ref 5–15)
BUN: 8 mg/dL (ref 6–20)
CO2: 28 mmol/L (ref 22–32)
Calcium: 9.2 mg/dL (ref 8.9–10.3)
Chloride: 101 mmol/L (ref 101–111)
Creatinine, Ser: 0.81 mg/dL (ref 0.61–1.24)
GFR calc Af Amer: >60 mL/min (ref >60)
GLUCOSE: 104 mg/dL — AB (ref 65–99)
POTASSIUM: 3.5 mmol/L (ref 3.5–5.1)
SODIUM: 140 mmol/L (ref 135–145)

## 2016-12-07 LAB — ETHANOL: ALCOHOL ETHYL (B): 339 mg/dL — AB (ref <5)

## 2016-12-07 MED ORDER — LORAZEPAM 1 MG PO TABS
1.0000 mg | ORAL_TABLET | Freq: Once | ORAL | Status: AC
  Administered 2016-12-07: 1 mg via ORAL
  Filled 2016-12-07: qty 1

## 2016-12-07 MED ORDER — CLONIDINE HCL 0.1 MG PO TABS
0.1000 mg | ORAL_TABLET | Freq: Once | ORAL | Status: AC
  Administered 2016-12-07: 0.1 mg via ORAL
  Filled 2016-12-07: qty 1

## 2016-12-07 NOTE — ED Provider Notes (Signed)
[facility]-EMERGENCY DEPT Provider Note   CSN: [id number] Arrival date & time: 12/07/16  1846     History   Chief Complaint Chief Complaint  Patient presents with  . V70.1    HPI Jimmy Castro is a 26 y.o. male.    The patient states that he spoke with alcohol rehabilitation center in [place]. He was told that they would take him for alcohol rehabilitation but he needed to come to the emergency department to get medically cleared and have the information faxed over to them.    Illness  This is a new problem. The current episode started more than 2 days ago. The problem occurs constantly. The problem has not changed since onset.Pertinent negatives include no chest pain, no abdominal pain and no headaches. Nothing aggravates the symptoms. Nothing relieves the symptoms. He has tried nothing for the symptoms.    Past Medical History:  Diagnosis Date  . Hypertension     There are no active problems to display for this patient.   History reviewed. No pertinent surgical history.     Home Medications    Prior to Admission medications   Medication Sig Start Date End Date Taking? Authorizing Provider  chlordiazePOXIDE (LIBRIUM) 25 MG capsule 50mg  PO TID x 1D, then 25-50mg  PO BID X 1D, then 25-50mg  PO QD X 1D 08/01/16   Bethann BerkshireJoseph Dilraj Killgore, MD  guaiFENesin (MUCINEX) 600 MG 12 hr tablet Take 600 mg by mouth 2 (two) times daily as needed for to loosen phlegm.    Historical Provider, MD  Omega-3 Fatty Acids (FISH OIL PO) Take 1 capsule by mouth daily.    Historical Provider, MD  ondansetron (ZOFRAN ODT) 4 MG disintegrating tablet Take 1 tablet (4 mg total) by mouth every 8 (eight) hours as needed for nausea or vomiting. 08/21/13   Devoria AlbeIva Knapp, MD  Pediatric Multiple Vit-C-FA (FLINSTONES GUMMIES OMEGA-3 DHA) CHEW Chew 3-4 tablets by mouth daily.    Historical Provider, MD  Riboflavin (VITAMIN B-2 PO) Take 1 tablet by mouth daily.    Historical Provider, MD    Family History History  reviewed. No pertinent family history.  Social History Social History  Substance Use Topics  . Smoking status: Current Every Day Smoker    Packs/day: 1.00    Types: Cigarettes  . Smokeless tobacco: Never Used  . Alcohol use Yes     Comment: daily     Allergies   Patient has no known allergies.   Review of Systems Review of Systems  Constitutional: Negative for appetite change and fatigue.  HENT: Negative for congestion, ear discharge and sinus pressure.   Eyes: Negative for discharge.  Respiratory: Negative for cough.   Cardiovascular: Negative for chest pain.  Gastrointestinal: Negative for abdominal pain and diarrhea.  Genitourinary: Negative for frequency and hematuria.  Musculoskeletal: Negative for back pain.  Skin: Negative for rash.  Neurological: Negative for seizures and headaches.  Psychiatric/Behavioral: Negative for hallucinations.     Physical Exam Updated Vital Signs BP 108/77 (BP Location: Left Arm)   Pulse 75   Temp 97.6 F (36.4 C) (Oral)   Resp 18   Ht 5\' 7"  (1.702 m)   Wt 139 lb (63 kg)   SpO2 100%   BMI 21.77 kg/m   Physical Exam  Constitutional: He is oriented to person, place, and time. He appears well-developed.  HENT:  Head: Normocephalic.  Eyes: Conjunctivae and EOM are normal. No scleral icterus.  Neck: Neck supple. No thyromegaly present.  Cardiovascular: Normal rate  and regular rhythm.  Exam reveals no gallop and no friction rub.   No murmur heard. Pulmonary/Chest: No stridor. He has no wheezes. He has no rales. He exhibits no tenderness.  Abdominal: He exhibits no distension. There is no tenderness. There is no rebound.  Musculoskeletal: Normal range of motion. He exhibits no edema.  Lymphadenopathy:    He has no cervical adenopathy.  Neurological: He is oriented to person, place, and time. He exhibits normal muscle tone. Coordination normal.  Skin: No rash noted. No erythema.  Psychiatric: He has a normal mood and affect. His  behavior is normal.     ED Treatments / Results  Labs (all labs ordered are listed, but only abnormal results are displayed) Labs Reviewed  BASIC METABOLIC PANEL - Abnormal; Notable for the following:       Result Value   Glucose, Bld 104 (*)    All other components within normal limits  CBC WITH DIFFERENTIAL/PLATELET - Abnormal; Notable for the following:    MCH 34.4 (*)    MCHC 38.4 (*)    Platelets 104 (*)    All other components within normal limits  ETHANOL - Abnormal; Notable for the following:    Alcohol, Ethyl (B) 339 (*)    All other components within normal limits  RAPID URINE DRUG SCREEN, HOSP PERFORMED    EKG  EKG Interpretation None       Radiology No results found.  Procedures Procedures (including critical care time)  Medications Ordered in ED Medications  LORazepam (ATIVAN) tablet 1 mg (1 mg Oral Given 12/07/16 1916)  cloNIDine (CATAPRES) tablet 0.1 mg (0.1 mg Oral Given 12/07/16 1916)     Initial Impression / Assessment and Plan / ED Course  I have reviewed the triage vital signs and the nursing notes.  Pertinent labs & imaging results that were available during my care of the patient were reviewed by me and considered in my medical decision making (see chart for details).     The patient's alcohol level was possibly 340. The rehabilitation center wanted under 150. I spoke with the patient told him not to drink any for the next 12 hours and he can come back to the emergency department for reevaluation.  Final Clinical Impressions(s) / ED Diagnoses   Final diagnoses:  ETOH abuse    New Prescriptions New Prescriptions   No medications on file     Bethann Berkshire, MD 12/07/16 2116

## 2016-12-07 NOTE — Discharge Instructions (Signed)
Stop drinking or alcohol. Return here tomorrow and your alcohol level be low enough for us to send the information to the rehabilitation center that she wanted to

## 2016-12-07 NOTE — ED Notes (Addendum)
Ethanal critical level 339 mg/dl Dr informed.

## 2016-12-07 NOTE — ED Notes (Addendum)
Called [facility] spoke to [employer] Booth RN who stated that his ethanol needs to be at least between 1.4-1.7 mg/dl and also added that he wpuld not have a nurse to admit until tomorow morning.  Dr. Truitt MerleNotified

## 2016-12-07 NOTE — ED Triage Notes (Signed)
Pt here for medical clearance for [facility] in [place] for alcohol detox.  States they told him to come here and be seen and then fax information to them.

## 2016-12-08 ENCOUNTER — Emergency Department (HOSPITAL_COMMUNITY)
Admission: EM | Admit: 2016-12-08 | Discharge: 2016-12-08 | Disposition: A | Discharge status: Home / Self Care | Payer: Self-pay | Attending: Emergency Medicine | Admitting: Emergency Medicine

## 2016-12-08 ENCOUNTER — Encounter (HOSPITAL_COMMUNITY): Payer: Self-pay | Admitting: Emergency Medicine

## 2016-12-08 DIAGNOSIS — F17210 Nicotine dependence, cigarettes, uncomplicated: Secondary | ICD-10-CM | POA: Insufficient documentation

## 2016-12-08 DIAGNOSIS — I10 Essential (primary) hypertension: Secondary | ICD-10-CM | POA: Insufficient documentation

## 2016-12-08 DIAGNOSIS — F1010 Alcohol abuse, uncomplicated: Secondary | ICD-10-CM

## 2016-12-08 DIAGNOSIS — Z79899 Other long term (current) drug therapy: Secondary | ICD-10-CM | POA: Insufficient documentation

## 2016-12-08 LAB — CBC WITH DIFFERENTIAL/PLATELET
BASOS ABS: 0 10*3/uL (ref 0.0–0.1)
BASOS PCT: 0 %
EOS ABS: 0 10*3/uL (ref 0.0–0.7)
Eosinophils Relative: 0 %
HCT: 40.8 % (ref 39.0–52.0)
Hemoglobin: 15.2 g/dL (ref 13.0–17.0)
Lymphocytes Relative: 10 %
Lymphs Abs: 0.7 10*3/uL (ref 0.7–4.0)
MCH: 33.6 pg (ref 26.0–34.0)
MCHC: 37.3 g/dL — ABNORMAL HIGH (ref 30.0–36.0)
MCV: 90.3 fL (ref 78.0–100.0)
MONOS PCT: 5 %
Monocytes Absolute: 0.3 10*3/uL (ref 0.1–1.0)
Neutro Abs: 6.1 10*3/uL (ref 1.7–7.7)
Neutrophils Relative %: 85 %
PLATELETS: 96 10*3/uL — AB (ref 150–400)
RBC: 4.52 MIL/uL (ref 4.22–5.81)
RDW: 13.6 % (ref 11.5–15.5)
WBC: 7.3 10*3/uL (ref 4.0–10.5)

## 2016-12-08 LAB — BASIC METABOLIC PANEL
Anion gap: 9 (ref 5–15)
BUN: 7 mg/dL (ref 6–20)
CO2: 29 mmol/L (ref 22–32)
CREATININE: 0.7 mg/dL (ref 0.61–1.24)
Calcium: 9.8 mg/dL (ref 8.9–10.3)
Chloride: 98 mmol/L — ABNORMAL LOW (ref 101–111)
GFR calc Af Amer: >60 mL/min (ref >60)
Glucose, Bld: 80 mg/dL (ref 65–99)
POTASSIUM: 4.3 mmol/L (ref 3.5–5.1)
SODIUM: 136 mmol/L (ref 135–145)

## 2016-12-08 LAB — RAPID URINE DRUG SCREEN, HOSP PERFORMED
AMPHETAMINES: NOT DETECTED
Barbiturates: NOT DETECTED
Benzodiazepines: NOT DETECTED
Cocaine: NOT DETECTED
OPIATES: NOT DETECTED
Tetrahydrocannabinol: NOT DETECTED

## 2016-12-08 LAB — ETHANOL

## 2016-12-08 MED ORDER — LORAZEPAM 1 MG PO TABS: 1.0000 mg | ORAL_TABLET | Freq: Four times a day (QID) | ORAL | 0 refills | Status: DC | PRN

## 2016-12-08 MED ORDER — LORAZEPAM 1 MG PO TABS
1.0000 mg | ORAL_TABLET | Freq: Once | ORAL | Status: AC
  Administered 2016-12-08: 1 mg via ORAL
  Filled 2016-12-08: qty 1

## 2016-12-08 NOTE — Discharge Instructions (Signed)
Go to [facility] at [place] tomorrow am.  Do not drink any alcohol

## 2016-12-08 NOTE — ED Triage Notes (Signed)
Patient here for medical clearance for alcohol withdrawal. [facility]. Patient seen here yesterday and was told to come back for repeat blood work (alcohol level. Per patient last alcoholic beverage yesterday at 1030. Patient reports having tremors from withdrawal.

## 2016-12-08 NOTE — ED Notes (Signed)
Pt understands to go to [facility] in [place] tomorrow am at 0800 for detox

## 2016-12-08 NOTE — ED Provider Notes (Signed)
[facility]-EMER[facility]ENCY DEPT Provider Note   CSN: [id number] Arrival date & time: 12/08/16  1422     History   Chief Complaint Chief Complaint  Patient presents with  . Delirium Tremens (DTS)    HPI Jimmy Castro is a 26 y.o. male.  Patient has history of alcohol abuse. He was sent over here for medical clearance. Patient has no complaints right now and has not had any alcohol recently    Illness  This is a recurrent problem. The current episode started more than 2 days ago. The problem occurs constantly. The problem has not changed since onset.Pertinent negatives include no chest pain, no abdominal pain and no headaches. Nothing aggravates the symptoms. Nothing relieves the symptoms. He has tried nothing for the symptoms.    Past Medical History:  Diagnosis Date  . Hypertension     There are no active problems to display for this patient.   History reviewed. No pertinent surgical history.     Home Medications    Prior to Admission medications   Medication Sig Start Date End Date Taking? Authorizing Provider  chlordiazePOXIDE (LIBRIUM) 25 MG capsule 50mg  PO TID x 1D, then 25-50mg  PO BID X 1D, then 25-50mg  PO QD X 1D 08/01/16   Bethann Berkshire, MD  guaiFENesin (MUCINEX) 600 MG 12 hr tablet Take 600 mg by mouth 2 (two) times daily as needed for to loosen phlegm.    Historical Provider, MD  LORazepam (ATIVAN) 1 MG tablet Take 1 tablet (1 mg total) by mouth every 6 (six) hours as needed for anxiety. 12/08/16   Bethann Berkshire, MD  Omega-3 Fatty Acids (FISH OIL PO) Take 1 capsule by mouth daily.    Historical Provider, MD  ondansetron (ZOFRAN ODT) 4 MG disintegrating tablet Take 1 tablet (4 mg total) by mouth every 8 (eight) hours as needed for nausea or vomiting. 08/21/13   Devoria Albe, MD  Pediatric Multiple Vit-C-FA (FLINSTONES GUMMIES OMEGA-3 DHA) CHEW Chew 3-4 tablets by mouth daily.    Historical Provider, MD  Riboflavin (VITAMIN B-2 PO) Take 1 tablet by mouth daily.    Historical  Provider, MD    Family History No family history on file.  Social History Social History  Substance Use Topics  . Smoking status: Current Every Day Smoker    Packs/day: 1.00    Types: Cigarettes  . Smokeless tobacco: Never Used  . Alcohol use Yes     Comment: daily     Allergies   Patient has no known allergies.   Review of Systems Review of Systems  Constitutional: Negative for appetite change and fatigue.  HENT: Negative for congestion, ear discharge and sinus pressure.   Eyes: Negative for discharge.  Respiratory: Negative for cough.   Cardiovascular: Negative for chest pain.  Gastrointestinal: Negative for abdominal pain and diarrhea.  Genitourinary: Negative for frequency and hematuria.  Musculoskeletal: Negative for back pain.  Skin: Negative for rash.  Neurological: Negative for seizures and headaches.  Psychiatric/Behavioral: Negative for hallucinations.     Physical Exam Updated Vital Signs BP 117/76 (BP Location: Left Arm)   Pulse 79   Temp 98.2 F (36.8 C) (Oral)   Resp 14   Ht 5\' 7"  (1.702 m)   Wt 137 lb (62.1 kg)   SpO2 100%   BMI 21.46 kg/m   Physical Exam  Constitutional: He is oriented to person, place, and time. He appears well-developed.  HENT:  Head: Normocephalic.  Eyes: Conjunctivae and EOM are normal. No scleral icterus.  Neck: Neck supple. No thyromegaly present.  Cardiovascular: Normal rate and regular rhythm.  Exam reveals no gallop and no friction rub.   No murmur heard. Pulmonary/Chest: No stridor. He has no wheezes. He has no rales. He exhibits no tenderness.  Abdominal: He exhibits no distension. There is no tenderness. There is no rebound.  Musculoskeletal: Normal range of motion. He exhibits no edema.  Lymphadenopathy:    He has no cervical adenopathy.  Neurological: He is oriented to person, place, and time. He exhibits normal muscle tone. Coordination normal.  Skin: No rash noted. No erythema.  Psychiatric: He has a  normal mood and affect. His behavior is normal.     ED Treatments / Results  Labs (all labs ordered are listed, but only abnormal results are displayed) Labs Reviewed  CBC WITH DIFFERENTIAL/PLATELET - Abnormal; Notable for the following:       Result Value   MCHC 37.3 (*)    Platelets 96 (*)    All other components within normal limits  BASIC METABOLIC PANEL - Abnormal; Notable for the following:    Chloride 98 (*)    All other components within normal limits  ETHANOL  RAPID URINE DRUG SCREEN, HOSP PERFORMED    EKG  EKG Interpretation None       Radiology No results found.  Procedures Procedures (including critical care time)  Medications Ordered in ED Medications  LORazepam (ATIVAN) tablet 1 mg (1 mg Oral Given 12/08/16 1518)     Initial Impression / Assessment and Plan / ED Course  I have reviewed the triage vital signs and the nursing notes.  Pertinent labs & imaging results that were available during my care of the patient were reviewed by me and considered in my medical decision making (see chart for details).     Patient with alcohol abuse. Patient is medically cleared [facility] at 8 AM tomorrow to the alcohol rehabilitation center. Patient was given a prescription of Ativan and told not to drink any alcohol tonight  Final Clinical Impressions(s) / ED Diagnoses   Final diagnoses:  ETOH abuse    New Prescriptions New Prescriptions   LORAZEPAM (ATIVAN) 1 MG TABLET    Take 1 tablet (1 mg total) by mouth every 6 (six) hours as needed for anxiety.     Bethann BerkshireJoseph Mikala Podoll, MD 12/08/16 [phone]

## 2017-02-20 ENCOUNTER — Emergency Department (HOSPITAL_COMMUNITY): Payer: Self-pay

## 2017-02-20 ENCOUNTER — Emergency Department (HOSPITAL_COMMUNITY)
Admission: EM | Admit: 2017-02-20 | Discharge: 2017-02-21 | Disposition: A | Discharge status: Home / Self Care | Payer: Self-pay | Attending: Emergency Medicine | Admitting: Emergency Medicine

## 2017-02-20 ENCOUNTER — Ambulatory Visit (HOSPITAL_COMMUNITY)
Admission: RE | Admit: 2017-02-20 | Discharge: 2017-02-20 | Disposition: A | Discharge status: Home / Self Care | Payer: Self-pay | Attending: Psychiatry | Admitting: Psychiatry

## 2017-02-20 DIAGNOSIS — Y92410 Unspecified street and highway as the place of occurrence of the external cause: Secondary | ICD-10-CM | POA: Insufficient documentation

## 2017-02-20 DIAGNOSIS — Y999 Unspecified external cause status: Secondary | ICD-10-CM | POA: Insufficient documentation

## 2017-02-20 DIAGNOSIS — Y939 Activity, unspecified: Secondary | ICD-10-CM | POA: Insufficient documentation

## 2017-02-20 DIAGNOSIS — T1490XA Injury, unspecified, initial encounter: Secondary | ICD-10-CM

## 2017-02-20 DIAGNOSIS — I10 Essential (primary) hypertension: Secondary | ICD-10-CM | POA: Insufficient documentation

## 2017-02-20 DIAGNOSIS — F17210 Nicotine dependence, cigarettes, uncomplicated: Secondary | ICD-10-CM | POA: Insufficient documentation

## 2017-02-20 DIAGNOSIS — F1020 Alcohol dependence, uncomplicated: Secondary | ICD-10-CM | POA: Insufficient documentation

## 2017-02-20 DIAGNOSIS — F329 Major depressive disorder, single episode, unspecified: Secondary | ICD-10-CM | POA: Insufficient documentation

## 2017-02-20 DIAGNOSIS — S92334A Nondisplaced fracture of third metatarsal bone, right foot, initial encounter for closed fracture: Secondary | ICD-10-CM | POA: Insufficient documentation

## 2017-02-20 DIAGNOSIS — F32A Depression, unspecified: Secondary | ICD-10-CM

## 2017-02-20 DIAGNOSIS — Z79899 Other long term (current) drug therapy: Secondary | ICD-10-CM | POA: Insufficient documentation
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
## 2017-02-20 NOTE — ED Notes (Signed)
Pt stated "my last drink was today @ 1500.  I usually drink no less than 80 oz a day."
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
## 2017-02-20 NOTE — H&P (Signed)
Behavioral Health Medical Screening Exam  Jimmy Castro is an 26 y.o. male.  Pt presents for alcohol detox and referral to [facility] in [place]. He reports worsening depression and increased in alcohol intake since January. He reports his stressors are relationship problems, financial problems and infidelity. He denies SI/HI/AVH, does not appear to be preoccupied or responding to internal stimuli. He is alert and oriented, and able to make sound decisions although he is physically incapacitated and inebriated. He has extensive hand tremors, unsteady gait and reports nose bleeds when intoxicated denies seizures at this time. Denies previous psychiatric history, inpatient history, suicide attempts, or medication management at this time. He is accompanied by his parents who are able to validate his story and seem very cooperative.   Total Time spent with patient: 20 minutes  Psychiatric Specialty Exam: Physical Exam  Nursing note and vitals reviewed. Constitutional: He is oriented to person, place, and time. He appears well-developed and well-nourished.  HENT:  Head: Normocephalic.  Eyes:  Erythema glossy  Neck: Normal range of motion.  Neurological: He is alert and oriented to person, place, and time. Coordination abnormal.    Review of Systems  Psychiatric/Behavioral: Positive for depression. Negative for hallucinations, memory loss, substance abuse and suicidal ideas. The patient is nervous/anxious and has insomnia.   All other systems reviewed and are negative.   There were no vitals taken for this visit.There is no height or weight on file to calculate BMI.  General Appearance: Casual, Fairly Groomed and thin framed and inebriated smiling and emotional disturbance  Eye Contact:  Fair  Speech:  Clear and Coherent and Slurred  Volume:  Normal  Mood:  Euphoric  Affect:  Inappropriate and full range  Thought Process:  Linear and Descriptions of Associations: Intact  Orientation:  Full  (Time, Place, and Person)  Thought Content:  Logical  Suicidal Thoughts:  No  Homicidal Thoughts:  No  Memory:  Immediate;   Fair Recent;   Fair Remote;   Poor  Judgement:  Impaired  Insight:  Lacking  Psychomotor Activity:  Increased and agitation (wringing of wrist and excessive grinning and facial expressioni)  Concentration: Concentration: Fair and Attention Span: Fair  Recall:  Fis[employer]Fair  Fund of Knowledge:Good  Language: Good  Akathisia:  No  Handed:  Right  AIMS (if indicated):     Assets:  Communication Skills Desire for Improvement Financial Resources/Insurance Leisure Time Physical Health Social Support  Sleep:       Musculoskeletal: Strength & Muscle Tone: decreased Gait & Station: unsteady Patient leans: N/A  There were no vitals taken for this visit.  Recommendations:  Based on my evaluation the patient appears to have an emergency medical condition for which I recommend the patient be transferred to the emergency department for further evaluation. Currently denies SI [facility] management at this time, begin detox protocol. Patient however does not meet criteria for inpatient admission. Will recommend SW follow up with him in the morning for referrals to outpatient resources and long term detox programs.   Truman Haywardakia S Starkes, FNP 02/20/2017, 10:48 PM

## 2017-02-20 NOTE — ED Notes (Signed)
Arvin CollardMelanie Keeny (Mother) [phone]. Patient gave permission for mom to get information.

## 2017-02-21 LAB — COMPREHENSIVE METABOLIC PANEL
ALT: 109 U/L — ABNORMAL HIGH (ref 17–63)
AST: 354 U/L — AB (ref 15–41)
Albumin: 4.5 g/dL (ref 3.5–5.0)
Alkaline Phosphatase: 149 U/L — ABNORMAL HIGH (ref 38–126)
Anion gap: 14 (ref 5–15)
BILIRUBIN TOTAL: 0.9 mg/dL (ref 0.3–1.2)
CO2: 26 mmol/L (ref 22–32)
Calcium: 9.1 mg/dL (ref 8.9–10.3)
Chloride: 98 mmol/L — ABNORMAL LOW (ref 101–111)
Creatinine, Ser: 0.6 mg/dL — ABNORMAL LOW (ref 0.61–1.24)
GFR calc Af Amer: >60 mL/min (ref >60)
Glucose, Bld: 92 mg/dL (ref 65–99)
POTASSIUM: 3.3 mmol/L — AB (ref 3.5–5.1)
Sodium: 138 mmol/L (ref 135–145)
TOTAL PROTEIN: 7.6 g/dL (ref 6.5–8.1)

## 2017-02-21 LAB — CBC WITH DIFFERENTIAL/PLATELET
Basophils Absolute: 0.1 10*3/uL (ref 0.0–0.1)
Basophils Relative: 1 %
EOS PCT: 0 %
Eosinophils Absolute: 0 10*3/uL (ref 0.0–0.7)
HEMATOCRIT: 36.3 % — AB (ref 39.0–52.0)
Hemoglobin: 14.1 g/dL (ref 13.0–17.0)
LYMPHS ABS: 2.3 10*3/uL (ref 0.7–4.0)
LYMPHS PCT: 28 %
MCH: 34.2 pg — ABNORMAL HIGH (ref 26.0–34.0)
MCHC: 38.8 g/dL — AB (ref 30.0–36.0)
MCV: 88.1 fL (ref 78.0–100.0)
MONO ABS: 0.8 10*3/uL (ref 0.1–1.0)
MONOS PCT: 10 %
Neutro Abs: 5.1 10*3/uL (ref 1.7–7.7)
Neutrophils Relative %: 62 %
Platelets: 147 10*3/uL — ABNORMAL LOW (ref 150–400)
RBC: 4.12 MIL/uL — AB (ref 4.22–5.81)
RDW: 13.6 % (ref 11.5–15.5)
WBC: 8.3 10*3/uL (ref 4.0–10.5)

## 2017-02-21 LAB — RAPID URINE DRUG SCREEN, HOSP PERFORMED
Amphetamines: NOT DETECTED
BENZODIAZEPINES: NOT DETECTED
Barbiturates: NOT DETECTED
Cocaine: NOT DETECTED
Opiates: NOT DETECTED
Tetrahydrocannabinol: NOT DETECTED

## 2017-02-21 LAB — SALICYLATE LEVEL: Salicylate Lvl: <7 mg/dL (ref 2.8–30.0)

## 2017-02-21 LAB — ACETAMINOPHEN LEVEL

## 2017-02-21 LAB — ETHANOL: ALCOHOL ETHYL (B): 277 mg/dL — AB (ref <5)

## 2017-02-21 MED ORDER — CHLORDIAZEPOXIDE HCL 25 MG PO CAPS: ORAL_CAPSULE | ORAL | 0 refills | Status: DC

## 2017-02-21 MED ORDER — GI COCKTAIL ~~LOC~~
30.0000 mL | Freq: Once | ORAL | Status: AC
  Administered 2017-02-21: 30 mL via ORAL
  Filled 2017-02-21 (×2): qty 30

## 2017-02-21 MED ORDER — LORAZEPAM 2 MG/ML IJ SOLN: 1.0000 mg | Freq: Once | INTRAMUSCULAR | Status: DC

## 2017-02-21 MED ORDER — LORAZEPAM 2 MG/ML IJ SOLN
0.5000 mg | Freq: Once | INTRAMUSCULAR | Status: AC
  Administered 2017-02-21: 0.5 mg via INTRAMUSCULAR
  Filled 2017-02-21: qty 1

## 2017-02-21 NOTE — ED Notes (Signed)
Marcus, TTS, in with pt. 
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
## 2017-02-21 NOTE — Discharge Instructions (Signed)
Follow up with an outpatient treatment facility as soon as you are able. Take librium as prescribed to manage withdrawal symptoms.  Wear a postop shoe for comfort as your xray suggests a possible fracture to your right foot. Take ibuprofen for pain, as needed. Ice your foot to limit swelling. Keep it elevated as much as you are able.
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
## 2017-02-21 NOTE — ED Notes (Signed)
Patient tolerated breakfast without complications, able to ambulate without assistance with steady gait. He is aware and has copy of resources for detox. Mother is present to take home.

## 2017-08-15 ENCOUNTER — Emergency Department (HOSPITAL_COMMUNITY): Payer: Self-pay

## 2017-08-15 ENCOUNTER — Other Ambulatory Visit: Payer: Self-pay

## 2017-08-15 ENCOUNTER — Encounter (HOSPITAL_COMMUNITY): Payer: Self-pay | Admitting: Emergency Medicine

## 2017-08-15 ENCOUNTER — Observation Stay (HOSPITAL_COMMUNITY)
Admission: EM | Admit: 2017-08-15 | Discharge: 2017-08-17 | Disposition: A | Discharge status: Home / Self Care | Payer: Self-pay | Attending: Internal Medicine | Admitting: Internal Medicine

## 2017-08-15 DIAGNOSIS — R7401 Elevation of levels of liver transaminase levels: Secondary | ICD-10-CM | POA: Diagnosis present

## 2017-08-15 DIAGNOSIS — I10 Essential (primary) hypertension: Secondary | ICD-10-CM

## 2017-08-15 DIAGNOSIS — Y990 Civilian activity done for income or pay: Secondary | ICD-10-CM | POA: Insufficient documentation

## 2017-08-15 DIAGNOSIS — D696 Thrombocytopenia, unspecified: Secondary | ICD-10-CM

## 2017-08-15 DIAGNOSIS — R569 Unspecified convulsions: Secondary | ICD-10-CM

## 2017-08-15 DIAGNOSIS — F1020 Alcohol dependence, uncomplicated: Secondary | ICD-10-CM

## 2017-08-15 DIAGNOSIS — F10239 Alcohol dependence with withdrawal, unspecified: Principal | ICD-10-CM | POA: Insufficient documentation

## 2017-08-15 DIAGNOSIS — R740 Nonspecific elevation of levels of transaminase and lactic acid dehydrogenase [LDH]: Secondary | ICD-10-CM | POA: Insufficient documentation

## 2017-08-15 DIAGNOSIS — Y929 Unspecified place or not applicable: Secondary | ICD-10-CM | POA: Insufficient documentation

## 2017-08-15 DIAGNOSIS — Z87891 Personal history of nicotine dependence: Secondary | ICD-10-CM | POA: Insufficient documentation

## 2017-08-15 DIAGNOSIS — F10939 Alcohol use, unspecified with withdrawal, unspecified: Secondary | ICD-10-CM

## 2017-08-15 DIAGNOSIS — Y939 Activity, unspecified: Secondary | ICD-10-CM | POA: Insufficient documentation

## 2017-08-15 DIAGNOSIS — Z23 Encounter for immunization: Secondary | ICD-10-CM | POA: Insufficient documentation

## 2017-08-15 DIAGNOSIS — W1839XA Other fall on same level, initial encounter: Secondary | ICD-10-CM | POA: Insufficient documentation

## 2017-08-15 DIAGNOSIS — S0001XA Abrasion of scalp, initial encounter: Secondary | ICD-10-CM | POA: Insufficient documentation

## 2017-08-15 HISTORY — DX: Alcohol abuse, uncomplicated: F10.10

## 2017-08-15 LAB — BASIC METABOLIC PANEL
ANION GAP: 13 (ref 5–15)
BUN: <5 mg/dL — ABNORMAL LOW (ref 6–20)
CALCIUM: 9.6 mg/dL (ref 8.9–10.3)
CO2: 25 mmol/L (ref 22–32)
Chloride: 95 mmol/L — ABNORMAL LOW (ref 101–111)
Creatinine, Ser: 0.87 mg/dL (ref 0.61–1.24)
Glucose, Bld: 165 mg/dL — ABNORMAL HIGH (ref 65–99)
Potassium: 3.5 mmol/L (ref 3.5–5.1)
SODIUM: 133 mmol/L — AB (ref 135–145)

## 2017-08-15 LAB — HEPATIC FUNCTION PANEL
ALBUMIN: 4.4 g/dL (ref 3.5–5.0)
ALK PHOS: 146 U/L — AB (ref 38–126)
ALT: 115 U/L — AB (ref 17–63)
AST: 184 U/L — AB (ref 15–41)
Bilirubin, Direct: 0.2 mg/dL (ref 0.1–0.5)
Indirect Bilirubin: 0.7 mg/dL (ref 0.3–0.9)
TOTAL PROTEIN: 8.2 g/dL — AB (ref 6.5–8.1)
Total Bilirubin: 0.9 mg/dL (ref 0.3–1.2)

## 2017-08-15 LAB — DIFFERENTIAL
BASOS ABS: 0 10*3/uL (ref 0.0–0.1)
Basophils Relative: 0 %
Eosinophils Absolute: 0 10*3/uL (ref 0.0–0.7)
Eosinophils Relative: 0 %
LYMPHS ABS: 0.5 10*3/uL — AB (ref 0.7–4.0)
LYMPHS PCT: 4 %
MONO ABS: 0.3 10*3/uL (ref 0.1–1.0)
MONOS PCT: 2 %
NEUTROS ABS: 13.8 10*3/uL — AB (ref 1.7–7.7)
Neutrophils Relative %: 94 %

## 2017-08-15 LAB — CBC
HCT: 44.4 % (ref 39.0–52.0)
HEMOGLOBIN: 15.7 g/dL (ref 13.0–17.0)
MCH: 31.6 pg (ref 26.0–34.0)
MCHC: 35.4 g/dL (ref 30.0–36.0)
MCV: 89.3 fL (ref 78.0–100.0)
Platelets: 108 10*3/uL — ABNORMAL LOW (ref 150–400)
RBC: 4.97 MIL/uL (ref 4.22–5.81)
RDW: 14.9 % (ref 11.5–15.5)
WBC: 14.6 10*3/uL — AB (ref 4.0–10.5)

## 2017-08-15 LAB — ETHANOL: Alcohol, Ethyl (B): <10 mg/dL (ref <10)

## 2017-08-15 MED ORDER — LORAZEPAM 2 MG/ML IJ SOLN
0.0000 mg | Freq: Four times a day (QID) | INTRAMUSCULAR | Status: DC
  Administered 2017-08-15: 2 mg via INTRAVENOUS
  Filled 2017-08-15: qty 1

## 2017-08-15 MED ORDER — LORAZEPAM 2 MG/ML IJ SOLN: 2.0000 mg | Freq: Once | INTRAMUSCULAR | Status: DC

## 2017-08-15 MED ORDER — ENOXAPARIN SODIUM 40 MG/0.4ML ~~LOC~~ SOLN: 40.0000 mg | SUBCUTANEOUS | Status: DC

## 2017-08-15 MED ORDER — VITAMIN B-1 100 MG PO TABS: 100.0000 mg | ORAL_TABLET | Freq: Every day | ORAL | Status: DC

## 2017-08-15 MED ORDER — HYDROCODONE-ACETAMINOPHEN 5-325 MG PO TABS: 1.0000 | ORAL_TABLET | ORAL | Status: DC | PRN

## 2017-08-15 MED ORDER — ACETAMINOPHEN 650 MG RE SUPP: 650.0000 mg | Freq: Four times a day (QID) | RECTAL | Status: DC | PRN

## 2017-08-15 MED ORDER — LORAZEPAM 2 MG/ML IJ SOLN: 0.0000 mg | Freq: Two times a day (BID) | INTRAMUSCULAR | Status: DC

## 2017-08-15 MED ORDER — THIAMINE HCL 100 MG/ML IJ SOLN
INTRAMUSCULAR | Status: AC
  Filled 2017-08-15: qty 2

## 2017-08-15 MED ORDER — VITAMIN B-1 100 MG PO TABS
100.0000 mg | ORAL_TABLET | Freq: Every day | ORAL | Status: DC
  Administered 2017-08-16 – 2017-08-17 (×2): 100 mg via ORAL
  Filled 2017-08-15 (×2): qty 1

## 2017-08-15 MED ORDER — FOLIC ACID 1 MG PO TABS
1.0000 mg | ORAL_TABLET | Freq: Every day | ORAL | Status: DC
  Administered 2017-08-16 – 2017-08-17 (×2): 1 mg via ORAL
  Filled 2017-08-15 (×2): qty 1

## 2017-08-15 MED ORDER — LORAZEPAM 1 MG PO TABS: 0.0000 mg | ORAL_TABLET | Freq: Four times a day (QID) | ORAL | Status: DC

## 2017-08-15 MED ORDER — THIAMINE HCL 100 MG/ML IJ SOLN
Freq: Once | INTRAVENOUS | Status: AC
  Administered 2017-08-16: via INTRAVENOUS
  Filled 2017-08-15: qty 1000

## 2017-08-15 MED ORDER — ONDANSETRON HCL 4 MG/2ML IJ SOLN: 4.0000 mg | Freq: Four times a day (QID) | INTRAMUSCULAR | Status: DC | PRN

## 2017-08-15 MED ORDER — LORAZEPAM 2 MG/ML IJ SOLN
1.0000 mg | Freq: Once | INTRAMUSCULAR | Status: AC
  Administered 2017-08-15: 1 mg via INTRAVENOUS
  Filled 2017-08-15: qty 1

## 2017-08-15 MED ORDER — M.V.I. ADULT IV INJ
INJECTION | INTRAVENOUS | Status: AC
  Filled 2017-08-15: qty 10

## 2017-08-15 MED ORDER — ONDANSETRON HCL 4 MG PO TABS: 4.0000 mg | ORAL_TABLET | Freq: Four times a day (QID) | ORAL | Status: DC | PRN

## 2017-08-15 MED ORDER — ACETAMINOPHEN 325 MG PO TABS
650.0000 mg | ORAL_TABLET | Freq: Four times a day (QID) | ORAL | Status: DC | PRN
  Administered 2017-08-16: 650 mg via ORAL
  Filled 2017-08-15: qty 2

## 2017-08-15 MED ORDER — LORAZEPAM 2 MG/ML IJ SOLN
2.0000 mg | INTRAMUSCULAR | Status: DC | PRN
  Administered 2017-08-16: 2 mg via INTRAVENOUS
  Filled 2017-08-15: qty 1

## 2017-08-15 MED ORDER — FOLIC ACID 5 MG/ML IJ SOLN
INTRAMUSCULAR | Status: AC
  Filled 2017-08-15: qty 0.2

## 2017-08-15 MED ORDER — ADULT MULTIVITAMIN W/MINERALS CH
1.0000 | ORAL_TABLET | Freq: Every day | ORAL | Status: DC
  Administered 2017-08-16 – 2017-08-17 (×2): 1 via ORAL
  Filled 2017-08-15 (×2): qty 1

## 2017-08-15 MED ORDER — LORAZEPAM 2 MG/ML IJ SOLN
INTRAMUSCULAR | Status: AC
  Filled 2017-08-15: qty 1

## 2017-08-15 MED ORDER — SODIUM CHLORIDE 0.9% FLUSH
3.0000 mL | Freq: Two times a day (BID) | INTRAVENOUS | Status: DC
  Administered 2017-08-16: 3 mL via INTRAVENOUS

## 2017-08-15 MED ORDER — LORAZEPAM 1 MG PO TABS: 0.0000 mg | ORAL_TABLET | Freq: Two times a day (BID) | ORAL | Status: DC

## 2017-08-15 MED ORDER — LORAZEPAM 2 MG/ML IJ SOLN: 1.0000 mg | Freq: Once | INTRAMUSCULAR | Status: DC

## 2017-08-15 MED ORDER — THIAMINE HCL 100 MG/ML IJ SOLN
100.0000 mg | Freq: Every day | INTRAMUSCULAR | Status: DC
  Filled 2017-08-15: qty 2

## 2017-08-15 NOTE — ED Triage Notes (Addendum)
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 Pt does have abrasion noted to back of left head area, no bleeding noted at present, upon ems arrival to pt, he was coa X4, upon arrival to er pt alert, able to answer questions, denies any pain, no mouth trauma or incontinence noted, c-collar in place, cms intact all extremities. CBG 198

## 2017-08-15 NOTE — H&P (Addendum)
History and Physical    Jimmy Castro:[id number] DOB: 09-Sep-1991 DOA: 08/15/2017  PCP: Patient, No Pcp Per   Patient coming from: Home  Chief Complaint: Generalized seizure  HPI: Jimmy Castro is a 26 y.o. male with medical history significant for severe alcohol dependence, now presenting to the emergency department after generalized seizure-like activity.  Patient has a long history of heavy daily alcohol use but reports that he has been trying to quit and has not consumed any in the past 2 days.  His mother noted that he had an upper extremity tremor when she was driving him to work today, was concerned about this, but the patient insisted on going to work as scheduled.  While at work, he reported feeling "funny" and was then observed to fall to the ground with generalized shaking.  Shaking stopped spontaneously and EMS was called for transport to the hospital. Family suspects that he drinks to cope with undiagnosed depression and marital problems.   ED Course: Upon arrival to the ED, patient is found to be afebrile, saturating well on room air, slightly tachycardic, and hypertensive.  EKG features of a sinus tachycardia with rate 104, LVH, and repolarization abnormality.  Chest x-ray is negative for acute cardiopulmonary disease.  Head CT is negative for acute intracranial abnormality.  Cervical spine CT is negative for acute pathology.  Chemistry panel reveals a sodium of 133, alkaline phosphatase 146, AST 184, and ALT 115.  CBC is notable for leukocytosis to 14,600 and a chronic thrombocytopenia with platelets 108,000.  Ethanol level is undetectable.  Patient was treated with Ativan in the emergency department.  He remained slightly hypertensive and tachycardic, but otherwise stable and in no apparent respiratory distress.  He will be admitted to stepdown unit for ongoing evaluation and management severe alcohol dependence with withdrawal and seizure.  Review of Systems:  All other  systems reviewed and apart from HPI, are negative.  Past Medical History:  Diagnosis Date  . Alcohol abuse   . Hypertension     History reviewed. No pertinent surgical history.   reports that he has quit smoking. His smoking use included cigarettes. He smoked 1.00 pack per day. he has never used smokeless tobacco. He reports that he drinks alcohol. He reports that he does not use drugs.  No Known Allergies  Family History  Problem Relation Age of Onset  . Seizures Neg Hx      Prior to Admission medications   Not on File    Physical Exam: Vitals:   08/15/17 2000 08/15/17 2021 08/15/17 2145 08/15/17 2146  BP: (!) 147/103 (!) 147/103 (!) 132/91 (!) 132/91  Pulse: 95 (!) 104 99 95  Resp: 15   (!) 22  Temp:      TempSrc:      SpO2: 99%   100%  Weight:      Height:          Constitutional: NAD, calm, somnolent after Aitvan but easily roused  Eyes: PERTLA, lids and conjunctivae normal ENMT: Mucous membranes are moist. Posterior pharynx clear of any exudate or lesions.   Neck: normal, supple, no masses, no thyromegaly Respiratory: clear to auscultation bilaterally, no wheezing, no crackles. Normal respiratory effort.  Cardiovascular: S1 & S2 heard, regular rate and rhythm. No extremity edema. No significant JVD. Abdomen: No distension, no tenderness, no masses palpated. Bowel sounds normal.  Musculoskeletal: no clubbing / cyanosis. Left parietal scalp swelling and tenderness.   Skin: no significant rashes, lesions, ulcers. Warm,  dry, well-perfused. Neurologic: CN 2-12 grossly intact. Sensation intact, DTR normal. Strength 5/5 in all 4 limbs.  Psychiatric: Somnolent, easily roused. Calm, cooperative.     Labs on Admission: I have personally reviewed following labs and imaging studies  CBC: Recent Labs  Lab 08/15/17 1946  WBC 14.6*  NEUTROABS 13.8*  HGB 15.7  HCT 44.4  MCV 89.3  PLT 108*   Basic Metabolic Panel: Recent Labs  Lab 08/15/17 1946  NA 133*  K  3.5  CL 95*  CO2 25  GLUCOSE 165*  BUN <5*  CREATININE 0.87  CALCIUM 9.6   GFR: Estimated Creatinine Clearance: 113.9 mL/min (by C-G formula based on SCr of 0.87 mg/dL). Liver Function Tests: Recent Labs  Lab 08/15/17 1946  AST 184*  ALT 115*  ALKPHOS 146*  BILITOT 0.9  PROT 8.2*  ALBUMIN 4.4   No results for input(s): LIPASE, AMYLASE in the last 168 hours. No results for input(s): AMMONIA in the last 168 hours. Coagulation Profile: No results for input(s): INR, PROTIME in the last 168 hours. Cardiac Enzymes: No results for input(s): CKTOTAL, CKMB, CKMBINDEX, TROPONINI in the last 168 hours. BNP (last 3 results) No results for input(s): PROBNP in the last 8760 hours. HbA1C: No results for input(s): HGBA1C in the last 72 hours. CBG: No results for input(s): GLUCAP in the last 168 hours. Lipid Profile: No results for input(s): CHOL, HDL, LDLCALC, TRIG, CHOLHDL, LDLDIRECT in the last 72 hours. Thyroid Function Tests: No results for input(s): TSH, T4TOTAL, FREET4, T3FREE, THYROIDAB in the last 72 hours. Anemia Panel: No results for input(s): VITAMINB12, FOLATE, FERRITIN, TIBC, IRON, RETICCTPCT in the last 72 hours. Urine analysis: No results found for: COLORURINE, APPEARANCEUR, LABSPEC, PHURINE, GLUCOSEU, HGBUR, BILIRUBINUR, KETONESUR, PROTEINUR, UROBILINOGEN, NITRITE, LEUKOCYTESUR Sepsis Labs: @LABRCNTIP (procalcitonin:4,lacticidven:4) )No results found for this or any previous visit (from the past 240 hour(s)).   Radiological Exams on Admission: Dg Chest 2 View  Result Date: 08/15/2017 CLINICAL DATA:  Seizure today.  Hypertension. EXAM: CHEST  2 VIEW COMPARISON:  12/29/2015 FINDINGS: The heart size and mediastinal contours are within normal limits. Both lungs are clear. The visualized skeletal structures are unremarkable. IMPRESSION: No active cardiopulmonary disease. Electronically Signed   By: Myles RosenthalJohn  Stahl M.D.   On: 08/15/2017 21:10   Ct Head Wo Contrast  Result  Date: 08/15/2017 CLINICAL DATA:  Seizure neck pain headache EXAM: CT HEAD WITHOUT CONTRAST CT CERVICAL SPINE WITHOUT CONTRAST TECHNIQUE: Multidetector CT imaging of the head and cervical spine was performed following the standard protocol without intravenous contrast. Multiplanar CT image reconstructions of the cervical spine were also generated. COMPARISON:  05/10/2010, 07/14/2011 FINDINGS: CT HEAD FINDINGS Brain: No acute territorial infarction, hemorrhage or intracranial mass is visualized. The ventricles are nonenlarged. Vascular: No hyperdense vessel or unexpected calcification. Skull: Normal. Negative for fracture or focal lesion. Sinuses/Orbits: Mucosal thickening in the ethmoid sinuses. No acute orbital abnormality Other: Moderate left parietal scalp soft tissue swelling. CT CERVICAL SPINE FINDINGS Alignment: Mild reversal of cervical lordosis. No subluxation. Facet alignment within normal limits Skull base and vertebrae: No acute fracture. No primary bone lesion or focal pathologic process. Stable calcification posterior to the tip of the dens. Soft tissues and spinal canal: No prevertebral fluid or swelling. No visible canal hematoma. Disc levels: No significant disc space narrowing. Foramen are patent bilaterally Upper chest: Negative. Other: None IMPRESSION: 1. No CT evidence for acute intracranial abnormality. Moderate left parietal scalp soft tissue swelling 2. Reversal of cervical lordosis.  No acute fracture. Electronically  Signed   By: Jasmine PangKim  Fujinaga M.D.   On: 08/15/2017 21:31   Ct Cervical Spine Wo Contrast  Result Date: 08/15/2017 CLINICAL DATA:  Seizure neck pain headache EXAM: CT HEAD WITHOUT CONTRAST CT CERVICAL SPINE WITHOUT CONTRAST TECHNIQUE: Multidetector CT imaging of the head and cervical spine was performed following the standard protocol without intravenous contrast. Multiplanar CT image reconstructions of the cervical spine were also generated. COMPARISON:  05/10/2010, 07/14/2011  FINDINGS: CT HEAD FINDINGS Brain: No acute territorial infarction, hemorrhage or intracranial mass is visualized. The ventricles are nonenlarged. Vascular: No hyperdense vessel or unexpected calcification. Skull: Normal. Negative for fracture or focal lesion. Sinuses/Orbits: Mucosal thickening in the ethmoid sinuses. No acute orbital abnormality Other: Moderate left parietal scalp soft tissue swelling. CT CERVICAL SPINE FINDINGS Alignment: Mild reversal of cervical lordosis. No subluxation. Facet alignment within normal limits Skull base and vertebrae: No acute fracture. No primary bone lesion or focal pathologic process. Stable calcification posterior to the tip of the dens. Soft tissues and spinal canal: No prevertebral fluid or swelling. No visible canal hematoma. Disc levels: No significant disc space narrowing. Foramen are patent bilaterally Upper chest: Negative. Other: None IMPRESSION: 1. No CT evidence for acute intracranial abnormality. Moderate left parietal scalp soft tissue swelling 2. Reversal of cervical lordosis.  No acute fracture. Electronically Signed   By: Jasmine PangKim  Fujinaga M.D.   On: 08/15/2017 21:31    EKG: Independently reviewed. Sinus tachycardia (rate 104), LVH with repolarization abnormality.   Assessment/Plan  1. Alcohol withdrawal with seizure  - Pt is a chronic alcoholic, reports not drinking for two days leading up to admission, was noted to have new tremor on morning of admit by family, and then went on to have generalized seizure-like activity at work  - Head CT negative; likely secondary to alcohol withdrawal  - Treated with IV Ativan in ED  - Plan to continue CIWA and prn Ativan, supplement vitamins and minerals, maintain seizure precautions, consult with social work for alcoholism recovery resources    2. Hypertension  - BP elevated in ED - Likely secondary to #1; plan to treat withdrawal and reassess    3. Thrombocytopenia  - Platelets 108,000 on admission, similar to  priors and with no bleeding evident  - Likely secondary to alcohol abuse    4. Elevated transaminases  - Mild elevations in serum transaminases noted on admission - Also elevated on prior chemistry panels and likely secondary to alcoholism  - No RUQ tenderness  5. Leukocytosis  - WBC is elevated to 14,600 on admission without fever or apparent infectious process  - Likely reactive to the seizure, will culture if febrile    DVT prophylaxis: SCD's Code Status: Full  Family Communication: Discussed with patient Disposition Plan: Admit to SDU  Consults called: None Admission status: Inpatient    Briscoe Deutscherimothy S Opyd, MD [facility] Pager [phone]  If 7PM-7AM, please contact night-coverage [email or web address] Password [email or web address]  08/15/2017, 10:22 PM

## 2017-08-15 NOTE — ED Notes (Signed)
Called AC to get banana bag but states she will give to pt once up in ICU

## 2017-08-15 NOTE — ED Provider Notes (Signed)
[facility] EMERGENCY DEPARTMENT Provider Note   CSN: [id number] Arrival date & time: 08/15/17  1928     History   Chief Complaint Chief Complaint  Patient presents with  . Seizures    HPI Jimmy Castro is a 26 y.o. male.  HPI  Pt was seen at 2000. Per EMS, pt's mother and pt report: Pt standing at work and "felt funny." Coworkers stated pt fell to the floor and had "shaking activity" for 1 to 2 minutes. EMS states pt was awake/alert on their arrival to scene. Pt's mother states pt has hx of daily etoh use. Pt states he has been "cutting down" with LD etoh 2 days ago. Pt's mother states she noticed pt's "hands were shaking" when she brought him to work earlier today. Pt hit his head on the floor during the seizure episode. Pt otherwise denies any other complaints. Denies CP/palpitations, no SOB/cough, no abd pain, no N/V/D, no incont of bowel/bladder, no neck or back pain, no focal motor weakness, no tingling/numbness in extremities.   Past Medical History:  Diagnosis Date  . Alcohol abuse   . Hypertension     Patient Active Problem List   Diagnosis Date Noted  . Hypertension 08/15/2017  . Severe alcohol dependence (HCC) 08/15/2017  . Alcohol withdrawal seizure (HCC) 08/15/2017  . Elevated transaminase level 08/15/2017  . Thrombocytopenia (HCC) 08/15/2017    History reviewed. No pertinent surgical history.     Home Medications    Prior to Admission medications   Not on File    Family History Family History  Problem Relation Age of Onset  . Seizures Neg Hx     Social History Social History   Tobacco Use  . Smoking status: Former Smoker    Packs/day: 1.00    Types: Cigarettes  . Smokeless tobacco: Never Used  Substance Use Topics  . Alcohol use: Yes    Comment: occasional  . Drug use: No     Allergies   Patient has no known allergies.   Review of Systems Review of Systems ROS: Statement: All systems negative except as marked or noted in the  HPI; Constitutional: Negative for fever and chills. ; ; Eyes: Negative for eye pain, redness and discharge. ; ; ENMT: Negative for ear pain, hoarseness, nasal congestion, sinus pressure and sore throat. ; ; Cardiovascular: Negative for chest pain, palpitations, diaphoresis, dyspnea and peripheral edema. ; ; Respiratory: Negative for cough, wheezing and stridor. ; ; Gastrointestinal: Negative for nausea, vomiting, diarrhea, abdominal pain, blood in stool, hematemesis, jaundice and rectal bleeding. . ; ; Genitourinary: Negative for dysuria, flank pain and hematuria. ; ; Musculoskeletal: +head injury. Negative for back pain and neck pain. Negative for swelling and deformity.; ; Skin: +abrasion. Negative for pruritus, rash, blisters, bruising and skin lesion.; ; Neuro: Negative for headache, lightheadedness and neck stiffness. Negative for weakness, extremity weakness, paresthesias, involuntary movement, +hands tremors, +seizure.       Physical Exam Updated Vital Signs BP (!) 132/91   Pulse (!) 101   Temp 98.8 F (37.1 C) (Oral)   Resp (!) 23   Ht 5\' 7"  (1.702 m)   Wt 62.6 kg (138 lb)   SpO2 98%   BMI 21.61 kg/m   Patient Vitals for the past 24 hrs:  BP Temp Temp src Pulse Resp SpO2 Height Weight  08/15/17 2200 (!) 132/91 - - (!) 101 (!) 23 98 % - -  08/15/17 2146 (!) 132/91 - - 95 (!) 22 100 % - -  08/15/17 2145 (!) 132/91 - - 99 - - - -  08/15/17 2021 (!) 147/103 - - (!) 104 - - - -  08/15/17 2000 (!) 147/103 - - 95 15 99 % - -  08/15/17 1933 - - - - - - 5\' 7"  (1.702 m) 62.6 kg (138 lb)  08/15/17 1932 (!) 150/93 98.8 F (37.1 C) Oral (!) 102 20 97 % - -      Physical Exam 2005: Physical examination:  Nursing notes reviewed; Vital signs and O2 SAT reviewed;  Constitutional: Well developed, Well nourished, Well hydrated, In no acute distress; Head:  Normocephalic, +superficial abrasion left scalp.; Eyes: EOMI, PERRL, No scleral icterus; ENMT: Mouth and pharynx normal, Mucous membranes  moist; Neck: Supple, Full range of motion, No lymphadenopathy; Cardiovascular: Regular rate and rhythm, No gallop; Respiratory: Breath sounds clear & equal bilaterally, No wheezes.  Speaking full sentences with ease, Normal respiratory effort/excursion; Chest: Nontender, Movement normal; Abdomen: Soft, Nontender, Nondistended, Normal bowel sounds; Genitourinary: No CVA tenderness; Spine:  No midline CS, TS, LS tenderness.;; Extremities: Pulses normal, No tenderness, No edema, No calf edema or asymmetry.; Neuro: AA&Ox3, Major CN grossly intact. No facial droop. Speech clear. No gross focal motor or sensory deficits in extremities. +hands tremor.; Skin: Color normal, Warm, Dry.   ED Treatments / Results  Labs (all labs ordered are listed, but only abnormal results are displayed)   EKG  EKG Interpretation  Date/Time:  Friday August 15 2017 19:35:18 EST Ventricular Rate:  104 PR Interval:    QRS Duration: 98 QT Interval:  360 QTC Calculation: 474 R Axis:   85 Text Interpretation:  Sinus tachycardia Probable left ventricular hypertrophy Probable inferior infarct, old Anterior ST elevation, probably due to LVH Lateral leads are also involved No old tracing to compare Confirmed by Pricilla LovelessGoldston, Scott [id number]) on 08/15/2017 7:57:06 PM       Radiology   Procedures Procedures (including critical care time)  Medications Ordered in ED Medications  LORazepam (ATIVAN) 2 MG/ML injection (not administered)  sodium chloride flush (NS) 0.9 % injection 3 mL (not administered)  acetaminophen (TYLENOL) tablet 650 mg (not administered)    Or  acetaminophen (TYLENOL) suppository 650 mg (not administered)  HYDROcodone-acetaminophen (NORCO/VICODIN) 5-325 MG per tablet 1-2 tablet (not administered)  ondansetron (ZOFRAN) tablet 4 mg (not administered)    Or  ondansetron (ZOFRAN) injection 4 mg (not administered)  sodium chloride 0.9 % 1,000 mL with thiamine 100 mg, folic acid 1 mg, multivitamins adult 10  mL infusion (not administered)  LORazepam (ATIVAN) injection 2-3 mg (not administered)  folic acid (FOLVITE) tablet 1 mg (not administered)  multivitamin with minerals tablet 1 tablet (not administered)  thiamine (VITAMIN B-1) tablet 100 mg (not administered)  LORazepam (ATIVAN) injection 1 mg (1 mg Intravenous Given 08/15/17 2024)     Initial Impression / Assessment and Plan / ED Course  I have reviewed the triage vital signs and the nursing notes.  Pertinent labs & imaging results that were available during my care of the patient were reviewed by me and considered in my medical decision making (see chart for details).  MDM Reviewed: previous chart, nursing note and vitals Reviewed previous: labs Interpretation: labs, ECG and x-ray   Results for orders placed or performed during the hospital encounter of 08/15/17  Basic metabolic panel - if new onset seizures  Result Value Ref Range   Sodium 133 (L) 135 - 145 mmol/L   Potassium 3.5 3.5 - 5.1 mmol/L   Chloride 95 (  L) 101 - 111 mmol/L   CO2 25 22 - 32 mmol/L   Glucose, Bld 165 (H) 65 - 99 mg/dL   BUN <5 (L) 6 - 20 mg/dL   Creatinine, Ser 1.61 0.61 - 1.24 mg/dL   Calcium 9.6 8.9 - 09.6 mg/dL   GFR calc non Af Amer >60 >60 mL/min   GFR calc Af Amer >60 >60 mL/min   Anion gap 13 5 - 15  CBC - if new onset seizures  Result Value Ref Range   WBC 14.6 (H) 4.0 - 10.5 K/uL   RBC 4.97 4.22 - 5.81 MIL/uL   Hemoglobin 15.7 13.0 - 17.0 g/dL   HCT 04.5 40.9 - 81.1 %   MCV 89.3 78.0 - 100.0 fL   MCH 31.6 26.0 - 34.0 pg   MCHC 35.4 30.0 - 36.0 g/dL   RDW 91.4 78.2 - 95.6 %   Platelets 108 (L) 150 - 400 K/uL  Hepatic function panel  Result Value Ref Range   Total Protein 8.2 (H) 6.5 - 8.1 g/dL   Albumin 4.4 3.5 - 5.0 g/dL   AST 213 (H) 15 - 41 U/L   ALT 115 (H) 17 - 63 U/L   Alkaline Phosphatase 146 (H) 38 - 126 U/L   Total Bilirubin 0.9 0.3 - 1.2 mg/dL   Bilirubin, Direct 0.2 0.1 - 0.5 mg/dL   Indirect Bilirubin 0.7 0.3 - 0.9  mg/dL  Ethanol  Result Value Ref Range   Alcohol, Ethyl (B) <10 <10 mg/dL  Differential  Result Value Ref Range   Neutrophils Relative % 94 %   Neutro Abs 13.8 (H) 1.7 - 7.7 K/uL   Lymphocytes Relative 4 %   Lymphs Abs 0.5 (L) 0.7 - 4.0 K/uL   Monocytes Relative 2 %   Monocytes Absolute 0.3 0.1 - 1.0 K/uL   Eosinophils Relative 0 %   Eosinophils Absolute 0.0 0.0 - 0.7 K/uL   Basophils Relative 0 %   Basophils Absolute 0.0 0.0 - 0.1 K/uL   Dg Chest 2 View Result Date: 08/15/2017 CLINICAL DATA:  Seizure today.  Hypertension. EXAM: CHEST  2 VIEW COMPARISON:  12/29/2015 FINDINGS: The heart size and mediastinal contours are within normal limits. Both lungs are clear. The visualized skeletal structures are unremarkable. IMPRESSION: No active cardiopulmonary disease. Electronically Signed   By: Myles Rosenthal M.D.   On: 08/15/2017 21:10   Ct Head Wo Contrast Result Date: 08/15/2017 CLINICAL DATA:  Seizure neck pain headache EXAM: CT HEAD WITHOUT CONTRAST CT CERVICAL SPINE WITHOUT CONTRAST TECHNIQUE: Multidetector CT imaging of the head and cervical spine was performed following the standard protocol without intravenous contrast. Multiplanar CT image reconstructions of the cervical spine were also generated. COMPARISON:  05/10/2010, 07/14/2011 FINDINGS: CT HEAD FINDINGS Brain: No acute territorial infarction, hemorrhage or intracranial mass is visualized. The ventricles are nonenlarged. Vascular: No hyperdense vessel or unexpected calcification. Skull: Normal. Negative for fracture or focal lesion. Sinuses/Orbits: Mucosal thickening in the ethmoid sinuses. No acute orbital abnormality Other: Moderate left parietal scalp soft tissue swelling. CT CERVICAL SPINE FINDINGS Alignment: Mild reversal of cervical lordosis. No subluxation. Facet alignment within normal limits Skull base and vertebrae: No acute fracture. No primary bone lesion or focal pathologic process. Stable calcification posterior to the tip  of the dens. Soft tissues and spinal canal: No prevertebral fluid or swelling. No visible canal hematoma. Disc levels: No significant disc space narrowing. Foramen are patent bilaterally Upper chest: Negative. Other: None IMPRESSION: 1. No CT evidence for  acute intracranial abnormality. Moderate left parietal scalp soft tissue swelling 2. Reversal of cervical lordosis.  No acute fracture. Electronically Signed   By: Jasmine PangKim  Fujinaga M.D.   On: 08/15/2017 21:31   Ct Cervical Spine Wo Contrast Result Date: 08/15/2017 CLINICAL DATA:  Seizure neck pain headache EXAM: CT HEAD WITHOUT CONTRAST CT CERVICAL SPINE WITHOUT CONTRAST TECHNIQUE: Multidetector CT imaging of the head and cervical spine was performed following the standard protocol without intravenous contrast. Multiplanar CT image reconstructions of the cervical spine were also generated. COMPARISON:  05/10/2010, 07/14/2011 FINDINGS: CT HEAD FINDINGS Brain: No acute territorial infarction, hemorrhage or intracranial mass is visualized. The ventricles are nonenlarged. Vascular: No hyperdense vessel or unexpected calcification. Skull: Normal. Negative for fracture or focal lesion. Sinuses/Orbits: Mucosal thickening in the ethmoid sinuses. No acute orbital abnormality Other: Moderate left parietal scalp soft tissue swelling. CT CERVICAL SPINE FINDINGS Alignment: Mild reversal of cervical lordosis. No subluxation. Facet alignment within normal limits Skull base and vertebrae: No acute fracture. No primary bone lesion or focal pathologic process. Stable calcification posterior to the tip of the dens. Soft tissues and spinal canal: No prevertebral fluid or swelling. No visible canal hematoma. Disc levels: No significant disc space narrowing. Foramen are patent bilaterally Upper chest: Negative. Other: None IMPRESSION: 1. No CT evidence for acute intracranial abnormality. Moderate left parietal scalp soft tissue swelling 2. Reversal of cervical lordosis.  No acute  fracture. Electronically Signed   By: Jasmine PangKim  Fujinaga M.D.   On: 08/15/2017 21:31    2145:  Pt's initial CIWA 22.  IV ativan given with improvement to 15.  Concern for further deterioration if discharged; will admit. T/C to [facility]. Antionette Charpyd, case discussed, including:  HPI, pertinent PM/SHx, VS/PE, dx testing, ED course and treatment:  Agreeable to admit.     Final Clinical Impressions(s) / ED Diagnoses   Final diagnoses:  Alcohol withdrawal seizure with complication (HCC)  Abrasion of scalp, initial encounter    ED Discharge Orders    None       Samuel JesterMcManus, Letrell Attwood, DO 08/18/17 2356

## 2017-08-15 NOTE — ED Notes (Signed)
Seizure pads on bed at this time

## 2017-08-15 NOTE — ED Notes (Signed)
Gave EKG to Dr. McManus  

## 2017-08-16 ENCOUNTER — Other Ambulatory Visit: Payer: Self-pay

## 2017-08-16 LAB — COMPREHENSIVE METABOLIC PANEL WITH GFR
ALT: 86 U/L — ABNORMAL HIGH (ref 17–63)
AST: 96 U/L — ABNORMAL HIGH (ref 15–41)
Albumin: 4 g/dL (ref 3.5–5.0)
Alkaline Phosphatase: 124 U/L (ref 38–126)
Anion gap: 10 (ref 5–15)
BUN: <5 mg/dL — ABNORMAL LOW (ref 6–20)
CO2: 23 mmol/L (ref 22–32)
Calcium: 8.8 mg/dL — ABNORMAL LOW (ref 8.9–10.3)
Chloride: 100 mmol/L — ABNORMAL LOW (ref 101–111)
Creatinine, Ser: 0.52 mg/dL — ABNORMAL LOW (ref 0.61–1.24)
GFR calc Af Amer: >60 mL/min
GFR calc non Af Amer: >60 mL/min
Glucose, Bld: 101 mg/dL — ABNORMAL HIGH (ref 65–99)
Potassium: 2.6 mmol/L — CL (ref 3.5–5.1)
Sodium: 133 mmol/L — ABNORMAL LOW (ref 135–145)
Total Bilirubin: 1.2 mg/dL (ref 0.3–1.2)
Total Protein: 7.3 g/dL (ref 6.5–8.1)

## 2017-08-16 LAB — RAPID URINE DRUG SCREEN, HOSP PERFORMED
AMPHETAMINES: NOT DETECTED
BARBITURATES: NOT DETECTED
Benzodiazepines: POSITIVE — AB
Cocaine: NOT DETECTED
Opiates: NOT DETECTED
TETRAHYDROCANNABINOL: POSITIVE — AB

## 2017-08-16 LAB — CBC WITH DIFFERENTIAL/PLATELET
Basophils Absolute: 0 K/uL (ref 0.0–0.1)
Basophils Relative: 0 %
Eosinophils Absolute: 0.1 K/uL (ref 0.0–0.7)
Eosinophils Relative: 1 %
HCT: 41.5 % (ref 39.0–52.0)
Hemoglobin: 14.7 g/dL (ref 13.0–17.0)
Lymphocytes Relative: 8 %
Lymphs Abs: 1.1 K/uL (ref 0.7–4.0)
MCH: 31.6 pg (ref 26.0–34.0)
MCHC: 35.4 g/dL (ref 30.0–36.0)
MCV: 89.2 fL (ref 78.0–100.0)
Monocytes Absolute: 0.4 K/uL (ref 0.1–1.0)
Monocytes Relative: 3 %
Neutro Abs: 11.6 K/uL — ABNORMAL HIGH (ref 1.7–7.7)
Neutrophils Relative %: 89 %
Platelets: 107 K/uL — ABNORMAL LOW (ref 150–400)
RBC: 4.65 MIL/uL (ref 4.22–5.81)
RDW: 15 % (ref 11.5–15.5)
WBC: 13.1 K/uL — ABNORMAL HIGH (ref 4.0–10.5)

## 2017-08-16 LAB — MAGNESIUM: Magnesium: 1.9 mg/dL (ref 1.7–2.4)

## 2017-08-16 LAB — MRSA PCR SCREENING: MRSA by PCR: NEGATIVE

## 2017-08-16 MED ORDER — POTASSIUM CHLORIDE CRYS ER 20 MEQ PO TBCR
40.0000 meq | EXTENDED_RELEASE_TABLET | ORAL | Status: AC
  Administered 2017-08-16 (×4): 40 meq via ORAL
  Filled 2017-08-16 (×4): qty 2

## 2017-08-16 MED ORDER — POTASSIUM CHLORIDE CRYS ER 20 MEQ PO TBCR
40.0000 meq | EXTENDED_RELEASE_TABLET | Freq: Once | ORAL | Status: AC
  Administered 2017-08-16: 40 meq via ORAL
  Filled 2017-08-16: qty 2

## 2017-08-16 MED ORDER — INFLUENZA VAC SPLIT QUAD 0.5 ML IM SUSY
0.5000 mL | PREFILLED_SYRINGE | INTRAMUSCULAR | Status: AC
  Administered 2017-08-17: 0.5 mL via INTRAMUSCULAR
  Filled 2017-08-16: qty 0.5

## 2017-08-16 MED ORDER — POTASSIUM CHLORIDE IN NACL 40-0.9 MEQ/L-% IV SOLN
INTRAVENOUS | Status: DC
  Administered 2017-08-16: 125 mL/h via INTRAVENOUS

## 2017-08-16 MED ORDER — MAGNESIUM SULFATE 2 GM/50ML IV SOLN
2.0000 g | Freq: Once | INTRAVENOUS | Status: AC
  Administered 2017-08-16: 2 g via INTRAVENOUS
  Filled 2017-08-16: qty 50

## 2017-08-16 MED ORDER — SODIUM CHLORIDE 0.9 % IV SOLN
INTRAVENOUS | Status: DC
  Administered 2017-08-16 – 2017-08-17 (×3): via INTRAVENOUS

## 2017-08-16 NOTE — Plan of Care (Signed)
Patient very serious about cessation of alcohol. Explained to patient and patient's mom about the symptoms of alcohol withdrawals and the time frame they can last. Discussed and explained to patient and mom what ativan is used for and how it works. Patient wishes to seek help for substance abuse and possibly marriage counseling.

## 2017-08-16 NOTE — Progress Notes (Signed)
CRITICAL VALUE ALERT  Critical Value:  K+-2.6  Date & Time Notied:  08/16/2017 0605  Provider Notified: Opyd MD

## 2017-08-16 NOTE — Progress Notes (Signed)
PROGRESS NOTE    Jimmy Castro  ZOX:[id number] DOB: Nov 14, 1990 DOA: 08/15/2017 PCP: Patient, No Pcp Per     Brief Narrative:  26 year old young man admitted from home on 11/30 after suffering an alcohol withdrawal seizure.  He is a drinker of about 40 ounces of beer if not more a day and decided to quit cold [place] 2 days ago.  Admission requested   Assessment & Plan:   Principal Problem:   Alcohol withdrawal seizure (HCC) Active Problems:   Hypertension   Severe alcohol dependence (HCC)   Elevated transaminase level   Thrombocytopenia (HCC)   Alcohol abuse with withdrawal seizures -No further seizure activity since admission. -Has been maintained on a CIWA protocol, has not exhibited further signs of withdrawals while hospitalized. -Continue thiamine/folate. -Patient has been counseled on dangers of abrupt quitting of ETOH use and advised to seek medical help in the future.  Hypokalemia -Replace orally, magnesium within normal limits of 1.9.  Mildly elevated LFTs -AST of 96, ALT of 86, likely related to alcohol abuse, normal synthetic function of the liver. -These will need to be rechecked in 8-12 weeks.   DVT prophylaxis: None, young and ambulatory Code Status: Full code Family Communication: Patient only Disposition Plan: Transfer to floor, anticipate discharge home over the next 24 hours  Consultants:   None  Procedures:   None  Antimicrobials:  Anti-infectives (From admission, onward)   None       Subjective: Has no complaints, no overnight issues, anxious for discharge home.  Objective: Vitals:   08/16/17 1000 08/16/17 1100 08/16/17 1200 08/16/17 1631  BP: 119/81 122/84 115/76 113/83  Pulse:    71  Resp: 19 (!) 25 19 20   Temp:   97.8 F (36.6 C) 98.6 F (37 C)  TempSrc:   Oral Oral  SpO2:    100%  Weight:      Height:        Intake/Output Summary (Last 24 hours) at 08/16/2017 1719 Last data filed at 08/16/2017 1100 Gross per 24 hour    Intake 566.66 ml  Output 200 ml  Net 366.66 ml   Filed Weights   08/15/17 1933 08/16/17 0011 08/16/17 0400  Weight: 62.6 kg (138 lb) 56.2 kg (123 lb 14.4 oz) 56.8 kg (125 lb 3.5 oz)    Examination:  General exam: Alert, awake, oriented x 3 Respiratory system: Clear to auscultation. Respiratory effort normal. Cardiovascular system:RRR. No murmurs, rubs, gallops. Gastrointestinal system: Abdomen is nondistended, soft and nontender. No organomegaly or masses felt. Normal bowel sounds heard. Central nervous system: Alert and oriented. No focal neurological deficits. Extremities: No C/C/E, +pedal pulses Skin: No rashes, lesions or ulcers Psychiatry: Judgement and insight appear normal. Mood & affect appropriate.     Data Reviewed: I have personally reviewed following labs and imaging studies  CBC: Recent Labs  Lab 08/15/17 1946 08/16/17 0436  WBC 14.6* 13.1*  NEUTROABS 13.8* 11.6*  HGB 15.7 14.7  HCT 44.4 41.5  MCV 89.3 89.2  PLT 108* 107*   Basic Metabolic Panel: Recent Labs  Lab 08/15/17 1946 08/16/17 0436 08/16/17 0509  NA 133* 133*  --   K 3.5 2.6*  --   CL 95* 100*  --   CO2 25 23  --   GLUCOSE 165* 101*  --   BUN <5* <5*  --   CREATININE 0.87 0.52*  --   CALCIUM 9.6 8.8*  --   MG  --   --  1.9   GFR:  Estimated Creatinine Clearance: 112.4 mL/min (A) (by C-G formula based on SCr of 0.52 mg/dL (L)). Liver Function Tests: Recent Labs  Lab 08/15/17 1946 08/16/17 0436  AST 184* 96*  ALT 115* 86*  ALKPHOS 146* 124  BILITOT 0.9 1.2  PROT 8.2* 7.3  ALBUMIN 4.4 4.0   No results for input(s): LIPASE, AMYLASE in the last 168 hours. No results for input(s): AMMONIA in the last 168 hours. Coagulation Profile: No results for input(s): INR, PROTIME in the last 168 hours. Cardiac Enzymes: No results for input(s): CKTOTAL, CKMB, CKMBINDEX, TROPONINI in the last 168 hours. BNP (last 3 results) No results for input(s): PROBNP in the last 8760 hours. HbA1C: No  results for input(s): HGBA1C in the last 72 hours. CBG: No results for input(s): GLUCAP in the last 168 hours. Lipid Profile: No results for input(s): CHOL, HDL, LDLCALC, TRIG, CHOLHDL, LDLDIRECT in the last 72 hours. Thyroid Function Tests: No results for input(s): TSH, T4TOTAL, FREET4, T3FREE, THYROIDAB in the last 72 hours. Anemia Panel: No results for input(s): VITAMINB12, FOLATE, FERRITIN, TIBC, IRON, RETICCTPCT in the last 72 hours. Urine analysis: No results found for: COLORURINE, APPEARANCEUR, LABSPEC, PHURINE, GLUCOSEU, HGBUR, BILIRUBINUR, KETONESUR, PROTEINUR, UROBILINOGEN, NITRITE, LEUKOCYTESUR Sepsis Labs: @LABRCNTIP (procalcitonin:4,lacticidven:4)  ) Recent Results (from the past 240 hour(s))  MRSA PCR Screening     Status: None   Collection Time: 08/16/17 12:00 AM  Result Value Ref Range Status   MRSA by PCR NEGATIVE NEGATIVE Final    Comment:        The GeneXpert MRSA Assay (FDA approved for NASAL specimens only), is one component of a comprehensive MRSA colonization surveillance program. It is not intended to diagnose MRSA infection nor to guide or monitor treatment for MRSA infections.          Radiology Studies: Dg Chest 2 View  Result Date: 08/15/2017 CLINICAL DATA:  Seizure today.  Hypertension. EXAM: CHEST  2 VIEW COMPARISON:  12/29/2015 FINDINGS: The heart size and mediastinal contours are within normal limits. Both lungs are clear. The visualized skeletal structures are unremarkable. IMPRESSION: No active cardiopulmonary disease. Electronically Signed   By: Myles RosenthalJohn  Stahl M.D.   On: 08/15/2017 21:10   Ct Head Wo Contrast  Result Date: 08/15/2017 CLINICAL DATA:  Seizure neck pain headache EXAM: CT HEAD WITHOUT CONTRAST CT CERVICAL SPINE WITHOUT CONTRAST TECHNIQUE: Multidetector CT imaging of the head and cervical spine was performed following the standard protocol without intravenous contrast. Multiplanar CT image reconstructions of the cervical spine  were also generated. COMPARISON:  05/10/2010, 07/14/2011 FINDINGS: CT HEAD FINDINGS Brain: No acute territorial infarction, hemorrhage or intracranial mass is visualized. The ventricles are nonenlarged. Vascular: No hyperdense vessel or unexpected calcification. Skull: Normal. Negative for fracture or focal lesion. Sinuses/Orbits: Mucosal thickening in the ethmoid sinuses. No acute orbital abnormality Other: Moderate left parietal scalp soft tissue swelling. CT CERVICAL SPINE FINDINGS Alignment: Mild reversal of cervical lordosis. No subluxation. Facet alignment within normal limits Skull base and vertebrae: No acute fracture. No primary bone lesion or focal pathologic process. Stable calcification posterior to the tip of the dens. Soft tissues and spinal canal: No prevertebral fluid or swelling. No visible canal hematoma. Disc levels: No significant disc space narrowing. Foramen are patent bilaterally Upper chest: Negative. Other: None IMPRESSION: 1. No CT evidence for acute intracranial abnormality. Moderate left parietal scalp soft tissue swelling 2. Reversal of cervical lordosis.  No acute fracture. Electronically Signed   By: Jasmine PangKim  Fujinaga M.D.   On: 08/15/2017 21:31  Ct Cervical Spine Wo Contrast  Result Date: 08/15/2017 CLINICAL DATA:  Seizure neck pain headache EXAM: CT HEAD WITHOUT CONTRAST CT CERVICAL SPINE WITHOUT CONTRAST TECHNIQUE: Multidetector CT imaging of the head and cervical spine was performed following the standard protocol without intravenous contrast. Multiplanar CT image reconstructions of the cervical spine were also generated. COMPARISON:  05/10/2010, 07/14/2011 FINDINGS: CT HEAD FINDINGS Brain: No acute territorial infarction, hemorrhage or intracranial mass is visualized. The ventricles are nonenlarged. Vascular: No hyperdense vessel or unexpected calcification. Skull: Normal. Negative for fracture or focal lesion. Sinuses/Orbits: Mucosal thickening in the ethmoid sinuses. No acute  orbital abnormality Other: Moderate left parietal scalp soft tissue swelling. CT CERVICAL SPINE FINDINGS Alignment: Mild reversal of cervical lordosis. No subluxation. Facet alignment within normal limits Skull base and vertebrae: No acute fracture. No primary bone lesion or focal pathologic process. Stable calcification posterior to the tip of the dens. Soft tissues and spinal canal: No prevertebral fluid or swelling. No visible canal hematoma. Disc levels: No significant disc space narrowing. Foramen are patent bilaterally Upper chest: Negative. Other: None IMPRESSION: 1. No CT evidence for acute intracranial abnormality. Moderate left parietal scalp soft tissue swelling 2. Reversal of cervical lordosis.  No acute fracture. Electronically Signed   By: Jasmine PangKim  Fujinaga M.D.   On: 08/15/2017 21:31        Scheduled Meds: . folic acid  1 mg Oral Daily  . [START ON 08/17/2017] Influenza vac split quadrivalent PF  0.5 mL Intramuscular Tomorrow-1000  . multivitamin with minerals  1 tablet Oral Daily  . potassium chloride  40 mEq Oral Q4H  . sodium chloride flush  3 mL Intravenous Q12H  . thiamine  100 mg Oral Daily   Continuous Infusions: . sodium chloride 100 mL/hr at 08/16/17 1046     LOS: 0 days    Time spent: 25 minutes. Greater than 50% of this time was spent in direct contact with the patient coordinating care.     Chaya JanEstela Hernandez Acosta, MD [facility] Pager [phone]  If 7PM-7AM, please contact night-coverage [email or web address] Password [email or web address] 08/16/2017, 5:19 PM

## 2017-08-17 DIAGNOSIS — R740 Nonspecific elevation of levels of transaminase and lactic acid dehydrogenase [LDH]: Secondary | ICD-10-CM

## 2017-08-17 DIAGNOSIS — F10230 Alcohol dependence with withdrawal, uncomplicated: Secondary | ICD-10-CM

## 2017-08-17 LAB — BASIC METABOLIC PANEL
Anion gap: 6 (ref 5–15)
BUN: 5 mg/dL — AB (ref 6–20)
CALCIUM: 9.2 mg/dL (ref 8.9–10.3)
CO2: 23 mmol/L (ref 22–32)
CREATININE: 0.61 mg/dL (ref 0.61–1.24)
Chloride: 108 mmol/L (ref 101–111)
Glucose, Bld: 88 mg/dL (ref 65–99)
Potassium: 3.7 mmol/L (ref 3.5–5.1)
SODIUM: 137 mmol/L (ref 135–145)

## 2017-08-17 LAB — HIV ANTIBODY (ROUTINE TESTING W REFLEX): HIV Screen 4th Generation wRfx: NONREACTIVE

## 2017-08-17 MED ORDER — THIAMINE HCL 100 MG PO TABS: 100.0000 mg | ORAL_TABLET | Freq: Every day | ORAL | 11 refills | Status: DC

## 2017-08-17 MED ORDER — ADULT MULTIVITAMIN W/MINERALS CH: 1.0000 | ORAL_TABLET | Freq: Every day | ORAL | Status: DC

## 2017-08-17 MED ORDER — FOLIC ACID 1 MG PO TABS: 1.0000 mg | ORAL_TABLET | Freq: Every day | ORAL | Status: DC

## 2017-08-17 NOTE — Progress Notes (Signed)
Patient states understanding of discharge instructions, prescriptions given 

## 2017-08-17 NOTE — Discharge Summary (Signed)
Physician Discharge Summary  Jimmy JimLyshod J Verba WUJ:[id number]:[id number] DOB: October 21, 1990 DOA: 08/15/2017  PCP: Patient, No Pcp Per  Admit date: 08/15/2017 Discharge date: 08/17/2017  Time spent: 45 minutes  Recommendations for Outpatient Follow-up:  -To be discharged home today. -Advised to follow-up with his PCP in 2 weeks.  Discharge Diagnoses:  Principal Problem:   Alcohol withdrawal seizure (HCC) Active Problems:   Hypertension   Severe alcohol dependence (HCC)   Elevated transaminase level   Thrombocytopenia (HCC)   Discharge Condition: Stable and improved  Filed Weights   08/15/17 1933 08/16/17 0011 08/16/17 0400  Weight: 62.6 kg (138 lb) 56.2 kg (123 lb 14.4 oz) 56.8 kg (125 lb 3.5 oz)    History of present illness:  As per Dr. Antionette Charpyd on 11/30:  Jimmy Castro is a 26 y.o. male with medical history significant for severe alcohol dependence, now presenting to the emergency department after generalized seizure-like activity.  Patient has a long history of heavy daily alcohol use but reports that he has been trying to quit and has not consumed any in the past 2 days.  His mother noted that he had an upper extremity tremor when she was driving him to work today, was concerned about this, but the patient insisted on going to work as scheduled.  While at work, he reported feeling "funny" and was then observed to fall to the ground with generalized shaking.  Shaking stopped spontaneously and EMS was called for transport to the hospital. Family suspects that he drinks to cope with undiagnosed depression and marital problems.   ED Course: Upon arrival to the ED, patient is found to be afebrile, saturating well on room air, slightly tachycardic, and hypertensive.  EKG features of a sinus tachycardia with rate 104, LVH, and repolarization abnormality.  Chest x-ray is negative for acute cardiopulmonary disease.  Head CT is negative for acute intracranial abnormality.  Cervical spine CT is  negative for acute pathology.  Chemistry panel reveals a sodium of 133, alkaline phosphatase 146, AST 184, and ALT 115.  CBC is notable for leukocytosis to 14,600 and a chronic thrombocytopenia with platelets 108,000.  Ethanol level is undetectable.  Patient was treated with Ativan in the emergency department.  He remained slightly hypertensive and tachycardic, but otherwise stable and in no apparent respiratory distress.  He will be admitted to stepdown unit for ongoing evaluation and management severe alcohol dependence with withdrawal and seizure.    Hospital Course:   Alcohol abuse with withdrawal seizures -No further seizure activity since admission. -Has been maintained on a CIWA protocol, has not exhibited further signs of withdrawals while hospitalized. -Continue thiamine/folate. -Patient has been counseled on dangers of abrupt quitting of ETOH use and advised to seek medical help in the future.  Hypokalemia -Replace orally, magnesium within normal limits of 1.9.  Mildly elevated LFTs -AST of 96, ALT of 86, likely related to alcohol abuse, normal synthetic function of the liver. -These will need to be rechecked in 8-12 weeks.    Procedures:  None  Consultations:  None  Discharge Instructions  Discharge Instructions    Diet - low sodium heart healthy   Complete by:  As directed    Increase activity slowly   Complete by:  As directed      Allergies as of 08/17/2017   No Known Allergies     Medication List    TAKE these medications   folic acid 1 MG tablet Commonly known as:  FOLVITE Take 1  tablet (1 mg total) by mouth daily. Start taking on:  08/18/2017   multivitamin with minerals Tabs tablet Take 1 tablet by mouth daily. Start taking on:  08/18/2017   thiamine 100 MG tablet Take 1 tablet (100 mg total) by mouth daily. Start taking on:  08/18/2017      No Known Allergies Follow-up Information    your regular physician. Schedule an appointment as soon  as possible for a visit in 2 week(s).            The results of significant diagnostics from this hospitalization (including imaging, microbiology, ancillary and laboratory) are listed below for reference.    Significant Diagnostic Studies: Dg Chest 2 View  Result Date: 08/15/2017 CLINICAL DATA:  Seizure today.  Hypertension. EXAM: CHEST  2 VIEW COMPARISON:  12/29/2015 FINDINGS: The heart size and mediastinal contours are within normal limits. Both lungs are clear. The visualized skeletal structures are unremarkable. IMPRESSION: No active cardiopulmonary disease. Electronically Signed   By: Myles RosenthalJohn  Stahl M.D.   On: 08/15/2017 21:10   Ct Head Wo Contrast  Result Date: 08/15/2017 CLINICAL DATA:  Seizure neck pain headache EXAM: CT HEAD WITHOUT CONTRAST CT CERVICAL SPINE WITHOUT CONTRAST TECHNIQUE: Multidetector CT imaging of the head and cervical spine was performed following the standard protocol without intravenous contrast. Multiplanar CT image reconstructions of the cervical spine were also generated. COMPARISON:  05/10/2010, 07/14/2011 FINDINGS: CT HEAD FINDINGS Brain: No acute territorial infarction, hemorrhage or intracranial mass is visualized. The ventricles are nonenlarged. Vascular: No hyperdense vessel or unexpected calcification. Skull: Normal. Negative for fracture or focal lesion. Sinuses/Orbits: Mucosal thickening in the ethmoid sinuses. No acute orbital abnormality Other: Moderate left parietal scalp soft tissue swelling. CT CERVICAL SPINE FINDINGS Alignment: Mild reversal of cervical lordosis. No subluxation. Facet alignment within normal limits Skull base and vertebrae: No acute fracture. No primary bone lesion or focal pathologic process. Stable calcification posterior to the tip of the dens. Soft tissues and spinal canal: No prevertebral fluid or swelling. No visible canal hematoma. Disc levels: No significant disc space narrowing. Foramen are patent bilaterally Upper chest:  Negative. Other: None IMPRESSION: 1. No CT evidence for acute intracranial abnormality. Moderate left parietal scalp soft tissue swelling 2. Reversal of cervical lordosis.  No acute fracture. Electronically Signed   By: Jasmine PangKim  Fujinaga M.D.   On: 08/15/2017 21:31   Ct Cervical Spine Wo Contrast  Result Date: 08/15/2017 CLINICAL DATA:  Seizure neck pain headache EXAM: CT HEAD WITHOUT CONTRAST CT CERVICAL SPINE WITHOUT CONTRAST TECHNIQUE: Multidetector CT imaging of the head and cervical spine was performed following the standard protocol without intravenous contrast. Multiplanar CT image reconstructions of the cervical spine were also generated. COMPARISON:  05/10/2010, 07/14/2011 FINDINGS: CT HEAD FINDINGS Brain: No acute territorial infarction, hemorrhage or intracranial mass is visualized. The ventricles are nonenlarged. Vascular: No hyperdense vessel or unexpected calcification. Skull: Normal. Negative for fracture or focal lesion. Sinuses/Orbits: Mucosal thickening in the ethmoid sinuses. No acute orbital abnormality Other: Moderate left parietal scalp soft tissue swelling. CT CERVICAL SPINE FINDINGS Alignment: Mild reversal of cervical lordosis. No subluxation. Facet alignment within normal limits Skull base and vertebrae: No acute fracture. No primary bone lesion or focal pathologic process. Stable calcification posterior to the tip of the dens. Soft tissues and spinal canal: No prevertebral fluid or swelling. No visible canal hematoma. Disc levels: No significant disc space narrowing. Foramen are patent bilaterally Upper chest: Negative. Other: None IMPRESSION: 1. No CT evidence for acute intracranial abnormality. Moderate  left parietal scalp soft tissue swelling 2. Reversal of cervical lordosis.  No acute fracture. Electronically Signed   By: Jasmine Pang M.D.   On: 08/15/2017 21:31    Microbiology: Recent Results (from the past 240 hour(s))  MRSA PCR Screening     Status: None   Collection Time:  08/16/17 12:00 AM  Result Value Ref Range Status   MRSA by PCR NEGATIVE NEGATIVE Final    Comment:        The GeneXpert MRSA Assay (FDA approved for NASAL specimens only), is one component of a comprehensive MRSA colonization surveillance program. It is not intended to diagnose MRSA infection nor to guide or monitor treatment for MRSA infections.      Labs: Basic Metabolic Panel: Recent Labs  Lab 08/15/17 1946 08/16/17 0436 08/16/17 0509 08/17/17 0853  NA 133* 133*  --  137  K 3.5 2.6*  --  3.7  CL 95* 100*  --  108  CO2 25 23  --  23  GLUCOSE 165* 101*  --  88  BUN <5* <5*  --  5*  CREATININE 0.87 0.52*  --  0.61  CALCIUM 9.6 8.8*  --  9.2  MG  --   --  1.9  --    Liver Function Tests: Recent Labs  Lab 08/15/17 1946 08/16/17 0436  AST 184* 96*  ALT 115* 86*  ALKPHOS 146* 124  BILITOT 0.9 1.2  PROT 8.2* 7.3  ALBUMIN 4.4 4.0   No results for input(s): LIPASE, AMYLASE in the last 168 hours. No results for input(s): AMMONIA in the last 168 hours. CBC: Recent Labs  Lab 08/15/17 1946 08/16/17 0436  WBC 14.6* 13.1*  NEUTROABS 13.8* 11.6*  HGB 15.7 14.7  HCT 44.4 41.5  MCV 89.3 89.2  PLT 108* 107*   Cardiac Enzymes: No results for input(s): CKTOTAL, CKMB, CKMBINDEX, TROPONINI in the last 168 hours. BNP: BNP (last 3 results) No results for input(s): BNP in the last 8760 hours.  ProBNP (last 3 results) No results for input(s): PROBNP in the last 8760 hours.  CBG: No results for input(s): GLUCAP in the last 168 hours.     Signed:  Chaya Jan  [facility] Pager: [phone] 08/17/2017, 10:35 AM

## 2018-01-07 ENCOUNTER — Other Ambulatory Visit: Payer: Self-pay

## 2018-01-07 ENCOUNTER — Encounter (HOSPITAL_COMMUNITY): Payer: Self-pay | Admitting: Emergency Medicine

## 2018-01-07 ENCOUNTER — Inpatient Hospital Stay (HOSPITAL_COMMUNITY)
Admission: EM | Admit: 2018-01-07 | Discharge: 2018-01-09 | DRG: 897 | Disposition: A | Discharge status: Home / Self Care | Payer: Self-pay | Attending: Family Medicine | Admitting: Family Medicine

## 2018-01-07 ENCOUNTER — Emergency Department (HOSPITAL_COMMUNITY): Payer: Self-pay

## 2018-01-07 DIAGNOSIS — F1020 Alcohol dependence, uncomplicated: Secondary | ICD-10-CM | POA: Diagnosis present

## 2018-01-07 DIAGNOSIS — Z79899 Other long term (current) drug therapy: Secondary | ICD-10-CM

## 2018-01-07 DIAGNOSIS — G9349 Other encephalopathy: Secondary | ICD-10-CM | POA: Diagnosis present

## 2018-01-07 DIAGNOSIS — R7401 Elevation of levels of liver transaminase levels: Secondary | ICD-10-CM

## 2018-01-07 DIAGNOSIS — R55 Syncope and collapse: Secondary | ICD-10-CM

## 2018-01-07 DIAGNOSIS — R569 Unspecified convulsions: Secondary | ICD-10-CM | POA: Diagnosis present

## 2018-01-07 DIAGNOSIS — E872 Acidosis: Secondary | ICD-10-CM | POA: Diagnosis present

## 2018-01-07 DIAGNOSIS — J329 Chronic sinusitis, unspecified: Secondary | ICD-10-CM | POA: Diagnosis present

## 2018-01-07 DIAGNOSIS — F10239 Alcohol dependence with withdrawal, unspecified: Principal | ICD-10-CM | POA: Diagnosis present

## 2018-01-07 DIAGNOSIS — R740 Nonspecific elevation of levels of transaminase and lactic acid dehydrogenase [LDH]: Secondary | ICD-10-CM | POA: Diagnosis present

## 2018-01-07 DIAGNOSIS — Z87891 Personal history of nicotine dependence: Secondary | ICD-10-CM

## 2018-01-07 DIAGNOSIS — I10 Essential (primary) hypertension: Secondary | ICD-10-CM | POA: Diagnosis present

## 2018-01-07 DIAGNOSIS — E860 Dehydration: Secondary | ICD-10-CM | POA: Diagnosis present

## 2018-01-07 LAB — COMPREHENSIVE METABOLIC PANEL
ALK PHOS: 221 U/L — AB (ref 38–126)
ALT: 136 U/L — AB (ref 17–63)
AST: 364 U/L — AB (ref 15–41)
Albumin: 4.7 g/dL (ref 3.5–5.0)
Anion gap: >20 — ABNORMAL HIGH (ref 5–15)
BUN: 5 mg/dL — AB (ref 6–20)
CALCIUM: 9.4 mg/dL (ref 8.9–10.3)
CO2: 15 mmol/L — AB (ref 22–32)
CREATININE: 0.89 mg/dL (ref 0.61–1.24)
Chloride: 98 mmol/L — ABNORMAL LOW (ref 101–111)
GFR calc Af Amer: >60 mL/min (ref >60)
GFR calc non Af Amer: >60 mL/min (ref >60)
GLUCOSE: 90 mg/dL (ref 65–99)
Potassium: 3.5 mmol/L (ref 3.5–5.1)
SODIUM: 135 mmol/L (ref 135–145)
Total Bilirubin: 1.3 mg/dL — ABNORMAL HIGH (ref 0.3–1.2)
Total Protein: 8.6 g/dL — ABNORMAL HIGH (ref 6.5–8.1)

## 2018-01-07 LAB — CBC WITH DIFFERENTIAL/PLATELET
BASOS ABS: 0.1 10*3/uL (ref 0.0–0.1)
Basophils Relative: 1 %
EOS PCT: 0 %
Eosinophils Absolute: 0 10*3/uL (ref 0.0–0.7)
HCT: 40.2 % (ref 39.0–52.0)
HEMOGLOBIN: 14.4 g/dL (ref 13.0–17.0)
LYMPHS ABS: 0.5 10*3/uL — AB (ref 0.7–4.0)
Lymphocytes Relative: 8 %
MCH: 35.1 pg — AB (ref 26.0–34.0)
MCHC: 35.8 g/dL (ref 30.0–36.0)
MCV: 98 fL (ref 78.0–100.0)
Monocytes Absolute: 0.3 10*3/uL (ref 0.1–1.0)
Monocytes Relative: 5 %
NEUTROS PCT: 86 %
Neutro Abs: 5.4 10*3/uL (ref 1.7–7.7)
PLATELETS: 176 10*3/uL (ref 150–400)
RBC: 4.1 MIL/uL — AB (ref 4.22–5.81)
RDW: 15.4 % (ref 11.5–15.5)
WBC: 6.3 10*3/uL (ref 4.0–10.5)

## 2018-01-07 LAB — URINALYSIS, ROUTINE W REFLEX MICROSCOPIC
BACTERIA UA: NONE SEEN
Bilirubin Urine: NEGATIVE
GLUCOSE, UA: NEGATIVE mg/dL
KETONES UR: 5 mg/dL — AB
Leukocytes, UA: NEGATIVE
Nitrite: NEGATIVE
PROTEIN: 100 mg/dL — AB
Specific Gravity, Urine: 1.011 (ref 1.005–1.030)
pH: 6 (ref 5.0–8.0)

## 2018-01-07 LAB — RAPID URINE DRUG SCREEN, HOSP PERFORMED
Amphetamines: NOT DETECTED
BENZODIAZEPINES: NOT DETECTED
Barbiturates: NOT DETECTED
COCAINE: NOT DETECTED
OPIATES: NOT DETECTED
Tetrahydrocannabinol: POSITIVE — AB

## 2018-01-07 LAB — ETHANOL: Alcohol, Ethyl (B): 37 mg/dL — ABNORMAL HIGH (ref <10)

## 2018-01-07 LAB — CBG MONITORING, ED: GLUCOSE-CAPILLARY: 120 mg/dL — AB (ref 65–99)

## 2018-01-07 MED ORDER — ACETAMINOPHEN 650 MG RE SUPP: 650.0000 mg | Freq: Four times a day (QID) | RECTAL | Status: DC | PRN

## 2018-01-07 MED ORDER — LEVETIRACETAM IN NACL 500 MG/100ML IV SOLN
500.0000 mg | Freq: Two times a day (BID) | INTRAVENOUS | Status: DC
  Administered 2018-01-08 – 2018-01-09 (×3): 500 mg via INTRAVENOUS
  Filled 2018-01-07 (×4): qty 100

## 2018-01-07 MED ORDER — SODIUM CHLORIDE 0.9 % IV BOLUS
1000.0000 mL | Freq: Once | INTRAVENOUS | Status: AC
  Administered 2018-01-07: 1000 mL via INTRAVENOUS

## 2018-01-07 MED ORDER — LEVETIRACETAM IN NACL 500 MG/100ML IV SOLN
500.0000 mg | Freq: Once | INTRAVENOUS | Status: AC
Start: 2018-01-07 — End: 2018-01-07
  Administered 2018-01-07: 500 mg via INTRAVENOUS
  Filled 2018-01-07 (×2): qty 100

## 2018-01-07 MED ORDER — LORAZEPAM 2 MG/ML IJ SOLN
1.0000 mg | Freq: Once | INTRAMUSCULAR | Status: AC
  Administered 2018-01-07: 1 mg via INTRAVENOUS
  Filled 2018-01-07: qty 1

## 2018-01-07 MED ORDER — LORAZEPAM 2 MG/ML IJ SOLN
1.0000 mg | INTRAMUSCULAR | Status: DC | PRN
  Administered 2018-01-08: 1 mg via INTRAVENOUS
  Administered 2018-01-08 – 2018-01-09 (×3): 2 mg via INTRAVENOUS
  Filled 2018-01-07 (×5): qty 1

## 2018-01-07 MED ORDER — LORAZEPAM 2 MG/ML IJ SOLN
INTRAMUSCULAR | Status: AC
  Filled 2018-01-07: qty 1

## 2018-01-07 MED ORDER — ONDANSETRON HCL 4 MG/2ML IJ SOLN: 4.0000 mg | Freq: Four times a day (QID) | INTRAMUSCULAR | Status: DC | PRN

## 2018-01-07 MED ORDER — LORAZEPAM 2 MG/ML IJ SOLN
1.0000 mg | Freq: Once | INTRAMUSCULAR | Status: AC
  Administered 2018-01-07: 1 mg via INTRAVENOUS

## 2018-01-07 MED ORDER — SODIUM CHLORIDE 0.9 % IV SOLN
INTRAVENOUS | Status: AC
  Administered 2018-01-08 – 2018-01-09 (×3): via INTRAVENOUS

## 2018-01-07 MED ORDER — ONDANSETRON HCL 4 MG PO TABS: 4.0000 mg | ORAL_TABLET | Freq: Four times a day (QID) | ORAL | Status: DC | PRN

## 2018-01-07 MED ORDER — ENOXAPARIN SODIUM 40 MG/0.4ML ~~LOC~~ SOLN
40.0000 mg | SUBCUTANEOUS | Status: DC
  Administered 2018-01-08 (×2): 40 mg via SUBCUTANEOUS
  Filled 2018-01-07 (×2): qty 0.4

## 2018-01-07 MED ORDER — ACETAMINOPHEN 325 MG PO TABS: 650.0000 mg | ORAL_TABLET | Freq: Four times a day (QID) | ORAL | Status: DC | PRN

## 2018-01-07 NOTE — ED Notes (Addendum)
Pt had an episode of eye tracking but would still look at family when name was called. He then began to shake his legs and arms back and forth, arched his back and started having convulsion like movement. MD Adriana Simasook at bedside. Pt became violent after even swinging at staff. Shortly after even pt is speaking with family and able to support airway effectively.

## 2018-01-07 NOTE — ED Notes (Signed)
Pt also reports he has been out of BP meds for several months and needs a refill, HTN with EMS.

## 2018-01-07 NOTE — ED Triage Notes (Signed)
Pt reports he was at work when he began to feel hot, diaphoretic and passed out. States he was bending down when he began to feel this way and breaking down boxes. Last meal was breakfast. Had a previous episode like this 6 weeks ago and was told he may have had a seizure. Pt has no hx of seizures and has not followed up with neurology. Does not appear post-ictal.

## 2018-01-07 NOTE — H&P (Signed)
History and Physical    Jimmy JimLyshod J Scipio WUJ:[id number] DOB: 26-Nov-1990 DOA: 01/07/2018  PCP: Patient, No Pcp Per   Patient coming from: Home  Chief Complaint: Loss of consciousness/dehydration and seizure in ED  HPI: Jimmy Castro is a 27 y.o. male with medical history significant for alcohol dependence with prior alcohol withdrawal seizures, hypertension, elevated LFTs, and thrombocytopenia who presents to the emergency department today on account of loss of consciousness while he was at work today.  He states that he was dehydrated and got quite hot and therefore was brought to the ED for evaluation.  He states that he had alcohol earlier this morning. No recent illness, fever, chills, sweats, or headache noted.   ED Course: In the ED, his vital signs were stable and he was started on 2 L of normal saline bolus for hydration and then was noted to have a tonic-clonic seizure for which he was given IV Ativan as well as 500 mg of IV Keppra.  He then became quite sleepy and postictal.  He is currently noted to have an elevated serum alcohol level.  He is also noted to have elevated LFTs with AST 364 and ALT 136.  UDS is positive for THC.  I have ordered head CT which is negative for any acute findings.  He is currently quite somnolent but arousable to voice.  Review of Systems: Cannot be adequately obtained due to patient condition.  Past Medical History:  Diagnosis Date  . Alcohol abuse   . Hypertension     History reviewed. No pertinent surgical history.   reports that he has quit smoking. His smoking use included cigarettes. He smoked 1.00 pack per day. He has never used smokeless tobacco. He reports that he drinks alcohol. He reports that he does not use drugs.  No Known Allergies  Family History  Problem Relation Age of Onset  . Seizures Neg Hx     Prior to Admission medications   Medication Sig Start Date End Date Taking? Authorizing Provider  BIOTIN PO Take 1 capsule by  mouth daily.   Yes [provider]  Multiple Vitamin (MULTIVITAMIN WITH MINERALS) TABS tablet Take 1 tablet by mouth daily. 08/18/17  Yes Philip AspenHernandez Acosta, Limmie PatriciaEstela Y, MD  thiamine 100 MG tablet Take 1 tablet (100 mg total) by mouth daily. 08/18/17  Yes Philip AspenHernandez Acosta, Limmie PatriciaEstela Y, MD  vitamin C (ASCORBIC ACID) 500 MG tablet Take 500 mg by mouth daily.   Yes [provider]    Physical Exam: Vitals:   01/07/18 2030 01/07/18 2043 01/07/18 2130 01/07/18 2230  BP: (!) 145/95 (!) 139/98 (!) 139/98 (!) 142/102  Pulse:  94 96 89  Resp:  16 (!) 24 18  Temp:      TempSrc:      SpO2:  100% 97% 99%  Weight:      Height:        Constitutional: NAD, calm, comfortable Vitals:   01/07/18 2030 01/07/18 2043 01/07/18 2130 01/07/18 2230  BP: (!) 145/95 (!) 139/98 (!) 139/98 (!) 142/102  Pulse:  94 96 89  Resp:  16 (!) 24 18  Temp:      TempSrc:      SpO2:  100% 97% 99%  Weight:      Height:       Eyes: lids and conjunctivae normal ENMT: Mucous membranes are moist.  Neck: normal, supple Respiratory: clear to auscultation bilaterally. Normal respiratory effort. No accessory muscle use.  Cardiovascular: Regular rate and  rhythm, no murmurs. No extremity edema. Abdomen: no tenderness, no distention. Bowel sounds positive.  Musculoskeletal:  No joint deformity upper and lower extremities.   Skin: no rashes, lesions, ulcers.  Psychiatric: Normal judgment and insight. Alert and oriented x 3. Normal mood.   Labs on Admission: I have personally reviewed following labs and imaging studies  CBC: Recent Labs  Lab 01/07/18 1910  WBC 6.3  NEUTROABS 5.4  HGB 14.4  HCT 40.2  MCV 98.0  PLT 176   Basic Metabolic Panel: Recent Labs  Lab 01/07/18 1910  NA 135  K 3.5  CL 98*  CO2 15*  GLUCOSE 90  BUN 5*  CREATININE 0.89  CALCIUM 9.4   GFR: Estimated Creatinine Clearance: 106.6 mL/min (by C-G formula based on SCr of 0.89 mg/dL). Liver Function Tests: Recent Labs  Lab  01/07/18 1910  AST 364*  ALT 136*  ALKPHOS 221*  BILITOT 1.3*  PROT 8.6*  ALBUMIN 4.7   No results for input(s): LIPASE, AMYLASE in the last 168 hours. No results for input(s): AMMONIA in the last 168 hours. Coagulation Profile: No results for input(s): INR, PROTIME in the last 168 hours. Cardiac Enzymes: No results for input(s): CKTOTAL, CKMB, CKMBINDEX, TROPONINI in the last 168 hours. BNP (last 3 results) No results for input(s): PROBNP in the last 8760 hours. HbA1C: No results for input(s): HGBA1C in the last 72 hours. CBG: Recent Labs  Lab 01/07/18 1825  GLUCAP 120*   Lipid Profile: No results for input(s): CHOL, HDL, LDLCALC, TRIG, CHOLHDL, LDLDIRECT in the last 72 hours. Thyroid Function Tests: No results for input(s): TSH, T4TOTAL, FREET4, T3FREE, THYROIDAB in the last 72 hours. Anemia Panel: No results for input(s): VITAMINB12, FOLATE, FERRITIN, TIBC, IRON, RETICCTPCT in the last 72 hours. Urine analysis:    Component Value Date/Time   COLORURINE YELLOW 01/07/2018 2010   APPEARANCEUR CLEAR 01/07/2018 2010   LABSPEC 1.011 01/07/2018 2010   PHURINE 6.0 01/07/2018 2010   GLUCOSEU NEGATIVE 01/07/2018 2010   HGBUR SMALL (A) 01/07/2018 2010   BILIRUBINUR NEGATIVE 01/07/2018 2010   KETONESUR 5 (A) 01/07/2018 2010   PROTEINUR 100 (A) 01/07/2018 2010   NITRITE NEGATIVE 01/07/2018 2010   LEUKOCYTESUR NEGATIVE 01/07/2018 2010    Radiological Exams on Admission: Ct Head Wo Contrast  Result Date: 01/07/2018 CLINICAL DATA:  27 y/o M; recent seizure. Patient recently stopped drinking. EXAM: CT HEAD WITHOUT CONTRAST TECHNIQUE: Contiguous axial images were obtained from the base of the skull through the vertex without intravenous contrast. COMPARISON:  08/15/2017 CT of the head. FINDINGS: Brain: No evidence of acute infarction, hemorrhage, hydrocephalus, extra-axial collection or mass lesion/mass effect. Vascular: No hyperdense vessel or unexpected calcification. Skull:  Normal. Negative for fracture or focal lesion. Sinuses/Orbits: Partial opacification of left greater than right ethmoid air cells. Otherwise negative. Other: None. IMPRESSION: No acute intracranial abnormality.  Stable negative CT of the brain. Electronically Signed   By: Mitzi Hansen M.D.   On: 01/07/2018 22:46    EKG: Independently reviewed.  Sinus rhythm at 97 bpm with some LVH and left atrial enlargement.  Assessment/Plan Principal Problem:   Seizure (HCC) Active Problems:   Hypertension   Severe alcohol dependence (HCC)   Elevated transaminase level   Syncope and collapse    1. Seizure.  Previously noted to be related to alcohol withdrawal but currently has alcohol in his system and it could simply be that this is too low with a level for him, but this is quite concerning.  CT  head was ordered with no acute findings.  He has been given Ativan and Keppra IV and is currently quite somnolent but arousable.  He will require close monitoring with neuro checks and stepdown unit and I will continue Keppra for now with consultation to neurology which patient has not had previously.  I still believe much of this may be metabolic in nature, but I still believe that he would benefit from formal evaluation. 2. Syncope and collapse.  This is likely related to dehydration and I will maintain on IV fluid time-limited for now. 3. Severe alcohol dependence.  Maintain on CIWA protocol.  Patient currently has alcohol in his system and this is also noted with his anion gap acidosis.  Recheck a.m. labs. 4. Transaminitis.  This is likely due to his alcoholism.  Will order right upper quadrant ultrasound for further evaluation as this is uptrending from his prior admission. 5. Cannabinoid use.  Patient needs to be counseled on cessation.   DVT prophylaxis: Lovenox Code Status: Full Family Communication: None at bedside Disposition Plan: Monitoring for seizure activity and further seizure  evaluation Consults called: Neurology in computer Admission status: Inpatient, stepdown unit   Gareld Obrecht Hoover Brunette DO [facility] Pager [phone]  If 7PM-7AM, please contact night-coverage [email or web address] Password [facility]  01/07/2018, 11:32 PM

## 2018-01-07 NOTE — ED Provider Notes (Signed)
[facility] Provider Note   CSN: [id number] Arrival date & time: 01/07/18  1815     History   Chief Complaint Chief Complaint  Patient presents with  . Loss of Consciousness    HPI Jimmy Castro is a 27 y.o. male.  Level 5 caveat for urgent need for intervention.  Patient states he was dehydrated from working in a [employer] job today.  He feels restless and agitated.  Further history obtained from mother.  She is concerned that he has a drinking problem and had a seizure several months ago.  CT head at that time was negative.  While in the emergency department, patient had a grand mal seizure.  He is now sleepy and postictal.  No prodromal illnesses.  No fever, sweats, chills, meningeal signs.     Past Medical History:  Diagnosis Date  . Alcohol abuse   . Hypertension     Patient Active Problem List   Diagnosis Date Noted  . Hypertension 08/15/2017  . Severe alcohol dependence (HCC) 08/15/2017  . Alcohol withdrawal seizure (HCC) 08/15/2017  . Elevated transaminase level 08/15/2017  . Thrombocytopenia (HCC) 08/15/2017    History reviewed. No pertinent surgical history.      Home Medications    Prior to Admission medications   Medication Sig Start Date End Date Taking? Authorizing Provider  BIOTIN PO Take 1 capsule by mouth daily.   Yes [provider]  Multiple Vitamin (MULTIVITAMIN WITH MINERALS) TABS tablet Take 1 tablet by mouth daily. 08/18/17  Yes Philip AspenHernandez Acosta, Limmie PatriciaEstela Y, MD  thiamine 100 MG tablet Take 1 tablet (100 mg total) by mouth daily. 08/18/17  Yes Philip AspenHernandez Acosta, Limmie PatriciaEstela Y, MD  vitamin C (ASCORBIC ACID) 500 MG tablet Take 500 mg by mouth daily.   Yes [provider]    Family History Family History  Problem Relation Age of Onset  . Seizures Neg Hx     Social History Social History   Tobacco Use  . Smoking status: Former Smoker    Packs/day: 1.00    Types: Cigarettes  . Smokeless tobacco: Never  Used  Substance Use Topics  . Alcohol use: Yes    Comment: occasional  . Drug use: No     Allergies   Patient has no known allergies.   Review of Systems Review of Systems  All other systems reviewed and are negative.    Physical Exam Updated Vital Signs BP (!) 139/98   Pulse 94   Temp 98.1 F (36.7 C) (Oral)   Resp 16   Ht 5\' 6"  (1.676 m)   Wt 59.9 kg (132 lb)   SpO2 100%   BMI 21.31 kg/m   Physical Exam  Constitutional: He is oriented to person, place, and time.  Diaphoretic, lucid  HENT:  Head: Normocephalic and atraumatic.  Eyes: Conjunctivae are normal.  Neck: Neck supple.  Cardiovascular: Normal rate and regular rhythm.  Pulmonary/Chest: Effort normal and breath sounds normal.  Abdominal: Soft. Bowel sounds are normal.  Musculoskeletal: Normal range of motion.  Neurological: He is alert and oriented to person, place, and time.  Skin: Skin is warm and dry.  Psychiatric: He has a normal mood and affect. His behavior is normal.  Nursing note and vitals reviewed.    ED Treatments / Results  Labs (all labs ordered are listed, but only abnormal results are displayed) Labs Reviewed  CBC WITH DIFFERENTIAL/PLATELET - Abnormal; Notable for the following components:      Result  Value   RBC 4.10 (*)    MCH 35.1 (*)    Lymphs Abs 0.5 (*)    All other components within normal limits  COMPREHENSIVE METABOLIC PANEL - Abnormal; Notable for the following components:   Chloride 98 (*)    CO2 15 (*)    BUN 5 (*)    Total Protein 8.6 (*)    AST 364 (*)    ALT 136 (*)    Alkaline Phosphatase 221 (*)    Total Bilirubin 1.3 (*)    Anion gap >20 (*)    All other components within normal limits  ETHANOL - Abnormal; Notable for the following components:   Alcohol, Ethyl (B) 37 (*)    All other components within normal limits  URINALYSIS, ROUTINE W REFLEX MICROSCOPIC - Abnormal; Notable for the following components:   Hgb urine dipstick SMALL (*)    Ketones, ur 5  (*)    Protein, ur 100 (*)    All other components within normal limits  RAPID URINE DRUG SCREEN, HOSP PERFORMED - Abnormal; Notable for the following components:   Tetrahydrocannabinol POSITIVE (*)    All other components within normal limits  CBG MONITORING, ED - Abnormal; Notable for the following components:   Glucose-Capillary 120 (*)    All other components within normal limits    EKG None  Radiology No results found.  Procedures Procedures (including critical care time)  Medications Ordered in ED Medications  sodium chloride 0.9 % bolus 1,000 mL (0 mLs Intravenous Stopped 01/07/18 2115)  LORazepam (ATIVAN) injection 1 mg (1 mg Intravenous Given 01/07/18 1955)  LORazepam (ATIVAN) injection 1 mg (1 mg Intravenous Given 01/07/18 2009)  levETIRAcetam (KEPPRA) IVPB 500 mg/100 mL premix (0 mg Intravenous Stopped 01/07/18 2051)  sodium chloride 0.9 % bolus 1,000 mL (0 mLs Intravenous Stopped 01/07/18 2029)     Initial Impression / Assessment and Plan / ED Course  I have reviewed the triage vital signs and the nursing notes.  Pertinent labs & imaging results that were available during my care of the patient were reviewed by me and considered in my medical decision making (see chart for details).     Patient had a grand mal seizure in the emergency department.  IV Ativan and IV Keppra was ordered.  This seemed to help dramatically.  Call level 37.  Drug screen positive for marijuana.  Metabolic profile acceptable.  Will admit to general medicine.   CRITICAL CARE Performed by: Donnetta Hutching Total critical care time: 30 minutes Critical care time was exclusive of separately billable procedures and treating other patients. Critical care was necessary to treat or prevent imminent or life-threatening deterioration. Critical care was time spent personally by me on the following activities: development of treatment plan with patient and/or surrogate as well as nursing, discussions with  consultants, evaluation of patient's response to treatment, examination of patient, obtaining history from patient or surrogate, ordering and performing treatments and interventions, ordering and review of laboratory studies, ordering and review of radiographic studies, pulse oximetry and re-evaluation of patient's condition.  Final Clinical Impressions(s) / ED Diagnoses   Final diagnoses:  Seizure [facility])    ED Discharge Orders    None       Donnetta Hutching, MD 01/07/18 2151

## 2018-01-07 NOTE — ED Notes (Signed)
Pt called this RN into room at this time stating that he believes he is in ETOH withdrawals. Reports he drinks heavily, a 40oz and "regular" beer multiple times a day. Last beer was a 40oz today at 1000. MD Adriana Simasook notified. Pt is diaphoretic and shaking.

## 2018-01-08 ENCOUNTER — Inpatient Hospital Stay (HOSPITAL_COMMUNITY): Payer: Self-pay

## 2018-01-08 ENCOUNTER — Encounter (HOSPITAL_COMMUNITY): Payer: Self-pay | Admitting: Family Medicine

## 2018-01-08 DIAGNOSIS — F1020 Alcohol dependence, uncomplicated: Secondary | ICD-10-CM

## 2018-01-08 DIAGNOSIS — R55 Syncope and collapse: Secondary | ICD-10-CM

## 2018-01-08 DIAGNOSIS — R740 Nonspecific elevation of levels of transaminase and lactic acid dehydrogenase [LDH]: Secondary | ICD-10-CM

## 2018-01-08 DIAGNOSIS — R569 Unspecified convulsions: Secondary | ICD-10-CM

## 2018-01-08 LAB — COMPREHENSIVE METABOLIC PANEL
ALBUMIN: 3.8 g/dL (ref 3.5–5.0)
ALT: 105 U/L — AB (ref 17–63)
AST: 264 U/L — AB (ref 15–41)
Alkaline Phosphatase: 168 U/L — ABNORMAL HIGH (ref 38–126)
Anion gap: 15 (ref 5–15)
CO2: 18 mmol/L — AB (ref 22–32)
CREATININE: 0.61 mg/dL (ref 0.61–1.24)
Calcium: 9 mg/dL (ref 8.9–10.3)
Chloride: 103 mmol/L (ref 101–111)
GFR calc Af Amer: >60 mL/min (ref >60)
GFR calc non Af Amer: >60 mL/min (ref >60)
Glucose, Bld: 65 mg/dL (ref 65–99)
POTASSIUM: 3.3 mmol/L — AB (ref 3.5–5.1)
SODIUM: 136 mmol/L (ref 135–145)
Total Bilirubin: 2 mg/dL — ABNORMAL HIGH (ref 0.3–1.2)
Total Protein: 6.8 g/dL (ref 6.5–8.1)

## 2018-01-08 LAB — CBC
HEMATOCRIT: 34.7 % — AB (ref 39.0–52.0)
Hemoglobin: 12.3 g/dL — ABNORMAL LOW (ref 13.0–17.0)
MCH: 34.7 pg — AB (ref 26.0–34.0)
MCHC: 35.4 g/dL (ref 30.0–36.0)
MCV: 98 fL (ref 78.0–100.0)
PLATELETS: 150 10*3/uL (ref 150–400)
RBC: 3.54 MIL/uL — ABNORMAL LOW (ref 4.22–5.81)
RDW: 15.4 % (ref 11.5–15.5)
WBC: 5.3 10*3/uL (ref 4.0–10.5)

## 2018-01-08 LAB — TSH: TSH: 1.666 u[IU]/mL (ref 0.350–4.500)

## 2018-01-08 LAB — LIPID PANEL
CHOLESTEROL: 235 mg/dL — AB (ref 0–200)
HDL: 134 mg/dL (ref >40)
LDL Cholesterol: 90 mg/dL (ref 0–99)
TRIGLYCERIDES: 55 mg/dL (ref <150)
Total CHOL/HDL Ratio: 1.8 RATIO
VLDL: 11 mg/dL (ref 0–40)

## 2018-01-08 LAB — MRSA PCR SCREENING: MRSA by PCR: NEGATIVE

## 2018-01-08 LAB — MAGNESIUM: Magnesium: 2 mg/dL (ref 1.7–2.4)

## 2018-01-08 LAB — VITAMIN B12: VITAMIN B 12: 1187 pg/mL — AB (ref 180–914)

## 2018-01-08 MED ORDER — FOLIC ACID 1 MG PO TABS
1.0000 mg | ORAL_TABLET | Freq: Every day | ORAL | Status: DC
  Administered 2018-01-08 – 2018-01-09 (×2): 1 mg via ORAL
  Filled 2018-01-08 (×2): qty 1

## 2018-01-08 MED ORDER — LORAZEPAM 1 MG PO TABS: 0.0000 mg | ORAL_TABLET | Freq: Two times a day (BID) | ORAL | Status: DC

## 2018-01-08 MED ORDER — ADULT MULTIVITAMIN W/MINERALS CH
1.0000 | ORAL_TABLET | Freq: Every day | ORAL | Status: DC
  Administered 2018-01-08 – 2018-01-09 (×2): 1 via ORAL
  Filled 2018-01-08 (×2): qty 1

## 2018-01-08 MED ORDER — POTASSIUM CHLORIDE 10 MEQ/100ML IV SOLN
10.0000 meq | INTRAVENOUS | Status: AC
  Administered 2018-01-08 (×4): 10 meq via INTRAVENOUS
  Filled 2018-01-08 (×4): qty 100

## 2018-01-08 MED ORDER — LORAZEPAM 1 MG PO TABS
0.0000 mg | ORAL_TABLET | Freq: Four times a day (QID) | ORAL | Status: DC
  Administered 2018-01-08 (×2): 1 mg via ORAL
  Filled 2018-01-08 (×2): qty 1

## 2018-01-08 MED ORDER — VITAMIN B-1 100 MG PO TABS
100.0000 mg | ORAL_TABLET | Freq: Every day | ORAL | Status: DC
  Administered 2018-01-08 – 2018-01-09 (×2): 100 mg via ORAL
  Filled 2018-01-08 (×2): qty 1

## 2018-01-08 MED ORDER — LORAZEPAM 2 MG/ML IJ SOLN: 1.0000 mg | Freq: Four times a day (QID) | INTRAMUSCULAR | Status: DC | PRN

## 2018-01-08 MED ORDER — LORAZEPAM 1 MG PO TABS: 1.0000 mg | ORAL_TABLET | Freq: Four times a day (QID) | ORAL | Status: DC | PRN

## 2018-01-08 MED ORDER — THIAMINE HCL 100 MG/ML IJ SOLN: 100.0000 mg | Freq: Every day | INTRAMUSCULAR | Status: DC

## 2018-01-08 NOTE — Progress Notes (Signed)
PROGRESS NOTE   Jimmy Castro  [id number]  DOB: 1991/04/05  DOA: 01/07/2018 PCP: Patient, No Pcp Per   Brief Admission Hx:  27 y.o. male with medical history significant for alcohol dependence with prior alcohol withdrawal seizures, hypertension, elevated LFTs, and thrombocytopenia who presents to the emergency department today on account of loss of consciousness while he was at work today.  He states that he was dehydrated and got quite hot and therefore was brought to the ED for evaluation.  He states that he had alcohol earlier this morning.  MDM/Assessment & Plan:   1. Seizure.  Previously noted to be related to alcohol withdrawal and suspect that this is similar as he had recently cut down on his drinking prior to this event.  He has a history of delirium tremens and will remain in SDU.  CT head was ordered with no acute findings.  MRI brain pending.  He has been given Ativan and Keppra IV.  He will require close monitoring with neuro checks and stepdown unit and I will continue Keppra for now with consultation to neurology which patient has not had previously.  2. Syncope and collapse.  This is likely related to dehydration.  Continue IVF hydration. 3. Severe alcohol dependence.  Pt endorses being a really heavy alcohol consumer.  Maintain on CIWA protocol.  Patient currently has alcohol in his system and this is also noted with his anion gap acidosis. 4. Transaminitis.  This is likely due to his chronic heavy alcoholism.  Right upper quadrant ultrasound pending for further evaluation and added acute hepatitis panel. 5. Cannabinoid use.    DVT prophylaxis: Lovenox Code Status: Full Family Communication: None at bedside Disposition Plan: Monitoring for seizure activity and further seizure evaluation Consults called: Neurology in computer Admission status: Inpatient, stepdown unit  Consultants:  neurology  Subjective: Pt without complaints.   Objective: Vitals:   01/08/18 0300 01/08/18 0400 01/08/18 0500 01/08/18 0600  BP: 126/77 121/80 124/73 124/81  Pulse: 92  87 91  Resp: 19 18 18 20   Temp:  97.9 F (36.6 C)    TempSrc:  Oral    SpO2: 97%  98% 99%  Weight:   53.3 kg (117 lb 8.1 oz)   Height:        Intake/Output Summary (Last 24 hours) at 01/08/2018 0754 Last data filed at 01/08/2018 0558 Gross per 24 hour  Intake 2443.33 ml  Output -  Net 2443.33 ml   Filed Weights   01/07/18 1819 01/08/18 0050 01/08/18 0500  Weight: 59.9 kg (132 lb) 53.3 kg (117 lb 8.1 oz) 53.3 kg (117 lb 8.1 oz)   REVIEW OF SYSTEMS  As per history otherwise all reviewed and reported negative  Exam:  General exam: emaciated male, awake, cooperative, NAD.  Respiratory system: Clear. No increased work of breathing. Cardiovascular system: S1 & S2 heard, RRR. No JVD, murmurs, gallops, clicks or pedal edema. Gastrointestinal system: Abdomen is nondistended, soft and nontender. Normal bowel sounds heard. Central nervous system: Alert and oriented. No focal neurological deficits. Extremities: no CCE.  Data Reviewed: Basic Metabolic Panel: Recent Labs  Lab 01/07/18 1910 01/08/18 0411  NA 135 136  K 3.5 3.3*  CL 98* 103  CO2 15* 18*  GLUCOSE 90 65  BUN 5* <5*  CREATININE 0.89 0.61  CALCIUM 9.4 9.0   Liver Function Tests: Recent Labs  Lab 01/07/18 1910 01/08/18 0411  AST 364* 264*  ALT 136* 105*  ALKPHOS 221* 168*  BILITOT 1.3* 2.0*  PROT 8.6* 6.8  ALBUMIN 4.7 3.8   No results for input(s): LIPASE, AMYLASE in the last 168 hours. No results for input(s): AMMONIA in the last 168 hours. CBC: Recent Labs  Lab 01/07/18 1910 01/08/18 0411  WBC 6.3 5.3  NEUTROABS 5.4  --   HGB 14.4 12.3*  HCT 40.2 34.7*  MCV 98.0 98.0  PLT 176 150   Cardiac Enzymes: No results for input(s): CKTOTAL, CKMB, CKMBINDEX, TROPONINI in the last 168 hours. CBG (last 3)  Recent Labs    01/07/18 1825  GLUCAP 120*   Recent Results (from the past 240 hour(s))  MRSA  PCR Screening     Status: None   Collection Time: 01/08/18 12:47 AM  Result Value Ref Range Status   MRSA by PCR NEGATIVE NEGATIVE Final    Comment:        The GeneXpert MRSA Assay (FDA approved for NASAL specimens only), is one component of a comprehensive MRSA colonization surveillance program. It is not intended to diagnose MRSA infection nor to guide or monitor treatment for MRSA infections. Performed at [facility], [address] [place], [place] [address]      Studies: Ct Head Wo Contrast  Result Date: 01/07/2018 CLINICAL DATA:  27 y/o M; recent seizure. Patient recently stopped drinking. EXAM: CT HEAD WITHOUT CONTRAST TECHNIQUE: Contiguous axial images were obtained from the base of the skull through the vertex without intravenous contrast. COMPARISON:  08/15/2017 CT of the head. FINDINGS: Brain: No evidence of acute infarction, hemorrhage, hydrocephalus, extra-axial collection or mass lesion/mass effect. Vascular: No hyperdense vessel or unexpected calcification. Skull: Normal. Negative for fracture or focal lesion. Sinuses/Orbits: Partial opacification of left greater than right ethmoid air cells. Otherwise negative. Other: None. IMPRESSION: No acute intracranial abnormality.  Stable negative CT of the brain. Electronically Signed   By: Mitzi Hansen M.D.   On: 01/07/2018 22:46   Scheduled Meds: . enoxaparin (LOVENOX) injection  40 mg Subcutaneous Q24H   Continuous Infusions: . sodium chloride 100 mL/hr at 01/08/18 0134  . levETIRAcetam Stopped (01/08/18 0558)  . potassium chloride      Principal Problem:   Seizure (HCC) Active Problems:   Hypertension   Severe alcohol dependence (HCC)   Elevated transaminase level   Syncope and collapse   Critical Care Time spent: 32 mins  Standley Dakins, MD, FAAFP [facility] Pager [phone]  If 7PM-7AM, please contact night-coverage [email or web address] Password [facility] 01/08/2018, 7:54 AM    LOS: 1 day

## 2018-01-08 NOTE — Consult Note (Signed)
Jimmy A. [name], MD     [email or web address]          Jimmy Castro is an 27 y.o. male.   ASSESSMENT/PLAN: 1. Resolved encephalopathy due to seizures. 2. Likely alcohol withdrawal seizures: The patient is currently on Keppra. We will continue this for now. An EEG has been obtained.  3. Alcoholism: Suggestions for additional treatment long-term with [facility] in [place] recommended.     The patient is a 27 year old black male who has a history of alcohol is seen. The patient presented with loss of consciousness and tremulousness. He is being evaluated in the emergency room wherhe was witnessed to have a generalized tonic-clonic seizure. It appears that he did have a small amount of oral trauma with tongue bite on the left. The chart indicates that the patient has been admitted with previous episodes of alcohol withdrawal seizures. The patient reports that he had his last drink 6 AM in the morning. He had this is the about 12 hours later. Patient does not complain of having any focal deficits. He does not report any muscle ache at this time. The review systems otherwise negative. Family reports that the patient indicated that he is trying to get help with alcoholism.   GENERAL: This a pleasant male in no acute distress.  HEENT:  There is evidence of a small tongue bite on the right.  ABDOMEN: soft  EXTREMITIES: there are couple of social laceration right wrist/hand with marked swelling.  BACK: normal  SKIN: Normal by inspection.    MENTAL STATUS: Alert and oriented. Speech, language and cognition are generally intact. Judgment and insight normal.   CRANIAL NERVES: Pupils are equal, round and reactive to light and accomodation; extra ocular movements are full, there is no significant nystagmus; visual fields are full; upper and lower facial muscles are normal in strength and symmetric, there is no flattening of the nasolabial folds; tongue is  midline; uvula is midline; shoulder elevation is normal.  MOTOR: Normal tone, bulk and strength; no pronator drift.  COORDINATION: Left finger to nose is normal, right finger to nose is normal, No rest tremor; no intention tremor; no postural tremor; no bradykinesia.  REFLEXES: Deep tendon reflexes are symmetrical and normal. Plantar reflexes are flexor bilaterally.   SENSATION: Normal to light touch, temperature, and pain.      Blood pressure (!) 145/114, pulse 89, temperature 97.8 F (36.6 C), temperature source Oral, resp. rate 14, height '5\' 6"'$  (1.676 m), weight 117 lb 8.1 oz (53.3 kg), SpO2 100 %.  Past Medical History:  Diagnosis Date  . Alcohol abuse   . Hypertension     History reviewed. No pertinent surgical history.  Family History  Problem Relation Age of Onset  . Seizures Neg Hx     Social History:  reports that he has quit smoking. His smoking use included cigarettes. He smoked 1.00 pack per day. He has never used smokeless tobacco. He reports that he drinks alcohol. He reports that he does not use drugs.  Allergies: No Known Allergies  Medications: Prior to Admission medications   Medication Sig Start Date End Date Taking? Authorizing Provider  BIOTIN PO Take 1 capsule by mouth daily.   Yes [provider]  Multiple Vitamin (MULTIVITAMIN WITH MINERALS) TABS tablet Take 1 tablet by mouth daily. 08/18/17  Yes Isaac Bliss, Rayford Halsted, MD  thiamine 100 MG tablet Take 1 tablet (100 mg total) by mouth daily. 08/18/17  Yes Isaac Bliss, Rayford Halsted, MD  vitamin C (ASCORBIC ACID) 500 MG tablet Take 500 mg by mouth daily.   Yes [provider]    Scheduled Meds: . enoxaparin (LOVENOX) injection  40 mg Subcutaneous Q24H  . folic acid  1 mg Oral Daily  . LORazepam  0-4 mg Oral Q6H   Followed by  . [START ON 01/10/2018] LORazepam  0-4 mg Oral Q12H  . multivitamin with minerals  1 tablet Oral Daily  . thiamine  100 mg Oral Daily   Or  . thiamine   100 mg Intravenous Daily   Continuous Infusions: . sodium chloride 75 mL/hr at 01/08/18 1548  . levETIRAcetam Stopped (01/08/18 0558)   PRN Meds:.LORazepam **OR** LORazepam, LORazepam, ondansetron **OR** ondansetron (ZOFRAN) IV     Results for orders placed or performed during the hospital encounter of 01/07/18 (from the past 48 hour(s))  CBG monitoring, ED     Status: Abnormal   Collection Time: 01/07/18  6:25 PM  Result Value Ref Range   Glucose-Capillary 120 (H) 65 - 99 mg/dL  CBC with Differential     Status: Abnormal   Collection Time: 01/07/18  7:10 PM  Result Value Ref Range   WBC 6.3 4.0 - 10.5 K/uL   RBC 4.10 (L) 4.22 - 5.81 MIL/uL   Hemoglobin 14.4 13.0 - 17.0 g/dL   HCT 40.2 39.0 - 52.0 %   MCV 98.0 78.0 - 100.0 fL   MCH 35.1 (H) 26.0 - 34.0 pg   MCHC 35.8 30.0 - 36.0 g/dL   RDW 15.4 11.5 - 15.5 %   Platelets 176 150 - 400 K/uL   Neutrophils Relative % 86 %   Neutro Abs 5.4 1.7 - 7.7 K/uL   Lymphocytes Relative 8 %   Lymphs Abs 0.5 (L) 0.7 - 4.0 K/uL   Monocytes Relative 5 %   Monocytes Absolute 0.3 0.1 - 1.0 K/uL   Eosinophils Relative 0 %   Eosinophils Absolute 0.0 0.0 - 0.7 K/uL   Basophils Relative 1 %   Basophils Absolute 0.1 0.0 - 0.1 K/uL    Comment: Performed at [facility], [address] [place], [place] [address]  Comprehensive metabolic panel     Status: Abnormal   Collection Time: 01/07/18  7:10 PM  Result Value Ref Range   Sodium 135 135 - 145 mmol/L   Potassium 3.5 3.5 - 5.1 mmol/L   Chloride 98 (L) 101 - 111 mmol/L   CO2 15 (L) 22 - 32 mmol/L   Glucose, Bld 90 65 - 99 mg/dL   BUN 5 (L) 6 - 20 mg/dL   Creatinine, Ser 0.89 0.61 - 1.24 mg/dL   Calcium 9.4 8.9 - 10.3 mg/dL   Total Protein 8.6 (H) 6.5 - 8.1 g/dL   Albumin 4.7 3.5 - 5.0 g/dL   AST 364 (H) 15 - 41 U/L   ALT 136 (H) 17 - 63 U/L   Alkaline Phosphatase 221 (H) 38 - 126 U/L   Total Bilirubin 1.3 (H) 0.3 - 1.2 mg/dL   GFR calc non Af Amer >60 >60 mL/min   GFR calc Af Amer >60  >60 mL/min    Comment: (NOTE) The eGFR has been calculated using the CKD EPI equation. This calculation has not been validated in all clinical situations. eGFR's persistently <60 mL/min signify possible Chronic Kidney Disease.    Anion gap >20 (H) 5 - 15    Comment: Performed at [facility], [address], [place], [place] [address]  Ethanol  Status: Abnormal   Collection Time: 01/07/18  8:10 PM  Result Value Ref Range   Alcohol, Ethyl (B) 37 (H) <10 mg/dL    Comment:        LOWEST DETECTABLE LIMIT FOR SERUM ALCOHOL IS 10 mg/dL FOR MEDICAL PURPOSES ONLY Performed at [facility], [address], [place], [place] [address]   Urinalysis, Routine w reflex microscopic     Status: Abnormal   Collection Time: 01/07/18  8:10 PM  Result Value Ref Range   Color, Urine YELLOW YELLOW   APPearance CLEAR CLEAR   Specific Gravity, Urine 1.011 1.005 - 1.030   pH 6.0 5.0 - 8.0   Glucose, UA NEGATIVE NEGATIVE mg/dL   Hgb urine dipstick SMALL (A) NEGATIVE   Bilirubin Urine NEGATIVE NEGATIVE   Ketones, ur 5 (A) NEGATIVE mg/dL   Protein, ur 100 (A) NEGATIVE mg/dL   Nitrite NEGATIVE NEGATIVE   Leukocytes, UA NEGATIVE NEGATIVE   RBC / HPF 0-5 0 - 5 RBC/hpf   WBC, UA 0-5 0 - 5 WBC/hpf   Bacteria, UA NONE SEEN NONE SEEN   Mucus PRESENT    Hyaline Casts, UA PRESENT     Comment: Performed at [facility], [address] [place], [place] [address]  Urine rapid drug screen (hosp performed)     Status: Abnormal   Collection Time: 01/07/18  8:10 PM  Result Value Ref Range   Opiates NONE DETECTED NONE DETECTED   Cocaine NONE DETECTED NONE DETECTED   Benzodiazepines NONE DETECTED NONE DETECTED   Amphetamines NONE DETECTED NONE DETECTED   Tetrahydrocannabinol POSITIVE (A) NONE DETECTED   Barbiturates NONE DETECTED NONE DETECTED    Comment: (NOTE) DRUG SCREEN FOR MEDICAL PURPOSES ONLY.  IF CONFIRMATION IS NEEDED FOR ANY PURPOSE, NOTIFY LAB WITHIN 5 DAYS. LOWEST DETECTABLE LIMITS FOR URINE DRUG  SCREEN Drug Class                     Cutoff (ng/mL) Amphetamine and metabolites    1000 Barbiturate and metabolites    200 Benzodiazepine                 678 Tricyclics and metabolites     300 Opiates and metabolites        300 Cocaine and metabolites        300 THC                            50 Performed at [facility], [address], [place], [place] [address]   MRSA PCR Screening     Status: None   Collection Time: 01/08/18 12:47 AM  Result Value Ref Range   MRSA by PCR NEGATIVE NEGATIVE    Comment:        The GeneXpert MRSA Assay (FDA approved for NASAL specimens only), is one component of a comprehensive MRSA colonization surveillance program. It is not intended to diagnose MRSA infection nor to guide or monitor treatment for MRSA infections. Performed at [facility], [address], [place], [place] [address]   Comprehensive metabolic panel     Status: Abnormal   Collection Time: 01/08/18  4:11 AM  Result Value Ref Range   Sodium 136 135 - 145 mmol/L   Potassium 3.3 (L) 3.5 - 5.1 mmol/L   Chloride 103 101 - 111 mmol/L   CO2 18 (L) 22 - 32 mmol/L   Glucose, Bld 65 65 - 99 mg/dL   BUN <5 (  L) 6 - 20 mg/dL   Creatinine, Ser 0.61 0.61 - 1.24 mg/dL   Calcium 9.0 8.9 - 10.3 mg/dL   Total Protein 6.8 6.5 - 8.1 g/dL   Albumin 3.8 3.5 - 5.0 g/dL   AST 264 (H) 15 - 41 U/L   ALT 105 (H) 17 - 63 U/L   Alkaline Phosphatase 168 (H) 38 - 126 U/L   Total Bilirubin 2.0 (H) 0.3 - 1.2 mg/dL   GFR calc non Af Amer >60 >60 mL/min   GFR calc Af Amer >60 >60 mL/min    Comment: (NOTE) The eGFR has been calculated using the CKD EPI equation. This calculation has not been validated in all clinical situations. eGFR's persistently <60 mL/min signify possible Chronic Kidney Disease.    Anion gap 15 5 - 15    Comment: Performed at [facility], [address] [place], [place] [address]  CBC     Status: Abnormal   Collection Time: 01/08/18  4:11 AM  Result Value Ref Range   WBC 5.3  4.0 - 10.5 K/uL   RBC 3.54 (L) 4.22 - 5.81 MIL/uL   Hemoglobin 12.3 (L) 13.0 - 17.0 g/dL   HCT 34.7 (L) 39.0 - 52.0 %   MCV 98.0 78.0 - 100.0 fL   MCH 34.7 (H) 26.0 - 34.0 pg   MCHC 35.4 30.0 - 36.0 g/dL   RDW 15.4 11.5 - 15.5 %   Platelets 150 150 - 400 K/uL    Comment: Performed at [facility], [address], [place], [place] [address]  Magnesium     Status: None   Collection Time: 01/08/18  9:27 AM  Result Value Ref Range   Magnesium 2.0 1.7 - 2.4 mg/dL    Comment: Performed at [facility], [address] [place], [place] [address]  TSH     Status: None   Collection Time: 01/08/18  9:27 AM  Result Value Ref Range   TSH 1.666 0.350 - 4.500 uIU/mL    Comment: Performed by a 3rd Generation assay with a functional sensitivity of <=0.01 uIU/mL. Performed at [facility], [address] [place], [place] [address]   Vitamin B12     Status: Abnormal   Collection Time: 01/08/18  9:27 AM  Result Value Ref Range   Vitamin B-12 1,187 (H) 180 - 914 pg/mL    Comment: (NOTE) This assay is not validated for testing neonatal or myeloproliferative syndrome specimens for Vitamin B12 levels. Performed at [facility], [place] [address], [place], [place] [address]   Lipid panel     Status: Abnormal   Collection Time: 01/08/18  9:27 AM  Result Value Ref Range   Cholesterol 235 (H) 0 - 200 mg/dL   Triglycerides 55 <150 mg/dL   HDL 134 >40 mg/dL   Total CHOL/HDL Ratio 1.8 RATIO   VLDL 11 0 - 40 mg/dL   LDL Cholesterol 90 0 - 99 mg/dL    Comment:        Total Cholesterol/HDL:CHD Risk Coronary Heart Disease Risk Table                     Men   Women  1/2 Average Risk   3.4   3.3  Average Risk       5.0   4.4  2 X Average Risk   9.6   7.1  3 X Average Risk  23.4   11.0        Use the calculated  Patient Ratio above and the CHD Risk Table to determine the patient's CHD Risk.        ATP III CLASSIFICATION (LDL):  <100     mg/dL   Optimal  100-129  mg/dL   Near or Above                     Optimal  130-159  mg/dL   Borderline  160-189  mg/dL   High  >190     mg/dL   Very High Performed at [place] [place], [place] [address]     Studies/Results:   Brain MRI scan is reviewed in person. No acute lesions are seen on DWI. No hemorrhages appreciated on SWI. There is mild global atrophy for age. No white matter lesions are appreciated.  Berkleigh Beckles A. Merlene Castro, M.D.  Diplomate, Tax adviser of Psychiatry and Neurology ( Neurology). 01/08/2018, 5:09 PM

## 2018-01-08 NOTE — ACP (Advance Care Planning) (Signed)
Shared Advance Directives information with Mr Luiz BlareGraves. He was not ready to discuss or complete. Left contact information with him also.

## 2018-01-09 ENCOUNTER — Inpatient Hospital Stay (HOSPITAL_COMMUNITY)
Admit: 2018-01-09 | Discharge: 2018-01-09 | Disposition: A | Discharge status: Home / Self Care | Payer: Self-pay | Attending: Neurology | Admitting: Neurology

## 2018-01-09 DIAGNOSIS — I10 Essential (primary) hypertension: Secondary | ICD-10-CM

## 2018-01-09 LAB — COMPREHENSIVE METABOLIC PANEL
ALK PHOS: 143 U/L — AB (ref 38–126)
ALT: 92 U/L — ABNORMAL HIGH (ref 17–63)
ANION GAP: 11 (ref 5–15)
AST: 181 U/L — ABNORMAL HIGH (ref 15–41)
Albumin: 3.5 g/dL (ref 3.5–5.0)
BILIRUBIN TOTAL: 1.3 mg/dL — AB (ref 0.3–1.2)
BUN: <5 mg/dL — ABNORMAL LOW (ref 6–20)
CALCIUM: 9.1 mg/dL (ref 8.9–10.3)
CO2: 21 mmol/L — AB (ref 22–32)
Chloride: 105 mmol/L (ref 101–111)
Creatinine, Ser: 0.58 mg/dL — ABNORMAL LOW (ref 0.61–1.24)
GFR calc non Af Amer: >60 mL/min (ref >60)
Glucose, Bld: 112 mg/dL — ABNORMAL HIGH (ref 65–99)
Potassium: 3.2 mmol/L — ABNORMAL LOW (ref 3.5–5.1)
SODIUM: 137 mmol/L (ref 135–145)
Total Protein: 6.3 g/dL — ABNORMAL LOW (ref 6.5–8.1)

## 2018-01-09 LAB — CBC WITH DIFFERENTIAL/PLATELET
BASOS PCT: 1 %
Basophils Absolute: 0 10*3/uL (ref 0.0–0.1)
EOS ABS: 0.1 10*3/uL (ref 0.0–0.7)
Eosinophils Relative: 1 %
HCT: 34.6 % — ABNORMAL LOW (ref 39.0–52.0)
HEMOGLOBIN: 12.1 g/dL — AB (ref 13.0–17.0)
Lymphocytes Relative: 19 %
Lymphs Abs: 1.2 10*3/uL (ref 0.7–4.0)
MCH: 34.5 pg — ABNORMAL HIGH (ref 26.0–34.0)
MCHC: 35 g/dL (ref 30.0–36.0)
MCV: 98.6 fL (ref 78.0–100.0)
Monocytes Absolute: 0.3 10*3/uL (ref 0.1–1.0)
Monocytes Relative: 4 %
NEUTROS PCT: 75 %
Neutro Abs: 4.7 10*3/uL (ref 1.7–7.7)
Platelets: 150 10*3/uL (ref 150–400)
RBC: 3.51 MIL/uL — AB (ref 4.22–5.81)
RDW: 15.5 % (ref 11.5–15.5)
WBC: 6.2 10*3/uL (ref 4.0–10.5)

## 2018-01-09 LAB — MAGNESIUM: Magnesium: 1.7 mg/dL (ref 1.7–2.4)

## 2018-01-09 LAB — HEPATITIS PANEL, ACUTE
HCV Ab: <0.1 s/co ratio (ref 0.0–0.9)
HEP B C IGM: NEGATIVE
Hep A IgM: NEGATIVE
Hepatitis B Surface Ag: NEGATIVE

## 2018-01-09 LAB — VITAMIN D 25 HYDROXY (VIT D DEFICIENCY, FRACTURES): Vit D, 25-Hydroxy: 30.3 ng/mL (ref 30.0–100.0)

## 2018-01-09 MED ORDER — POTASSIUM CHLORIDE CRYS ER 20 MEQ PO TBCR: 40.0000 meq | EXTENDED_RELEASE_TABLET | Freq: Once | ORAL | Status: DC

## 2018-01-09 MED ORDER — LEVETIRACETAM 500 MG PO TABS
500.0000 mg | ORAL_TABLET | Freq: Two times a day (BID) | ORAL | Status: DC
  Administered 2018-01-09: 500 mg via ORAL
  Filled 2018-01-09: qty 1

## 2018-01-09 MED ORDER — LEVETIRACETAM 500 MG PO TABS: 500.0000 mg | ORAL_TABLET | Freq: Two times a day (BID) | ORAL | 0 refills | Status: AC

## 2018-01-09 MED ORDER — MAGNESIUM SULFATE 4 GM/100ML IV SOLN
4.0000 g | Freq: Once | INTRAVENOUS | Status: AC
  Administered 2018-01-09: 4 g via INTRAVENOUS
  Filled 2018-01-09: qty 100

## 2018-01-09 MED ORDER — FOLIC ACID 1 MG PO TABS: 1.0000 mg | ORAL_TABLET | Freq: Every day | ORAL | 3 refills | Status: AC

## 2018-01-09 MED ORDER — POTASSIUM CHLORIDE CRYS ER 20 MEQ PO TBCR
60.0000 meq | EXTENDED_RELEASE_TABLET | Freq: Once | ORAL | Status: AC
  Administered 2018-01-09: 60 meq via ORAL
  Filled 2018-01-09: qty 3

## 2018-01-09 NOTE — Progress Notes (Signed)
Patient refused wheelchair to car. Stated he wanted to walk. Patient walked down to main entrance with no assistance needed.

## 2018-01-09 NOTE — Progress Notes (Signed)
EEG completed; results pending.    

## 2018-01-09 NOTE — Procedures (Signed)
  [facility] Lopez Dentinger A. Gerilyn Pilgrimoonquah, MD     [email or web address]           HISTORY: This is a [age] year old presents with new-onset seizures.  MEDICATIONS: Scheduled Meds: Continuous Infusions: PRN Meds:.  Prior to Admission medications   Medication Sig Start Date End Date Taking? Authorizing Provider  BIOTIN PO Take 1 capsule by mouth daily.    [provider]  folic acid (FOLVITE) 1 MG tablet Take 1 tablet (1 mg total) by mouth daily. 01/09/18 01/09/19  Johnson, Clanford L, MD  levETIRAcetam (KEPPRA) 500 MG tablet Take 1 tablet (500 mg total) by mouth 2 (two) times daily. 01/09/18 02/08/18  Cleora FleetJohnson, Clanford L, MD  Multiple Vitamin (MULTIVITAMIN WITH MINERALS) TABS tablet Take 1 tablet by mouth daily. 08/18/17   Philip AspenHernandez Acosta, Limmie PatriciaEstela Y, MD  thiamine 100 MG tablet Take 1 tablet (100 mg total) by mouth daily. 08/18/17   Philip AspenHernandez Acosta, Limmie PatriciaEstela Y, MD  vitamin C (ASCORBIC ACID) 500 MG tablet Take 500 mg by mouth daily.    [provider]      ANALYSIS: A 16 channel recording using standard 10 20 measurements is conducted for 22 minutes. There is a well-formed posterior rhythm of 10 Hz which attenuates with eye opening. The low-voltage activity is slow in general. Awake and drowsy activities are observed. There is increased spindles seen throughout the recording. Photic simulation is carried out without abnormal changes of the Activity. There is no focal neurological slowing. There is no epileptiform activity is observed.   IMPRESSION: 1. This is a normal recording of the awake and drowsy states.      Jabir Dahlem A. Gerilyn Pilgrimoonquah, M.D.  Diplomate, Biomedical engineerAmerican Board of Psychiatry and Neurology ( Neurology).

## 2018-01-09 NOTE — Clinical Social Work Note (Signed)
Clinical Social Work Assessment  Patient Details  Name: Jimmy Castro MRN: [id number] Date of Birth: 03/19/1991  Date of referral:  01/09/18               Reason for consult:  Substance Use/ETOH Abuse                Permission sought to share information with:    Permission granted to share information::     Name::        Agency::     Relationship::     Contact Information:     Housing/Transportation Living arrangements for the past 2 months:  Single Family Home Source of Information:  Patient Patient Interpreter Needed:  None Criminal Activity/Legal Involvement Pertinent to Current Situation/Hospitalization:  No - Comment as needed Significant Relationships:  Parents, Other Family Members, Friend Lives with:  Parents Do you feel safe going back to the place where you live?  Yes Need for family participation in patient care:  No (Coment)  Care giving concerns: Pt is independent in ADLs   Social Worker assessment / plan: Pt is a 27 year old male referred to [facility] for AODA concerns. Met with pt today to assess. Per pt, he lives with his mom here in [place]. He is employed full time. Pt states he drinks beer on a daily basis. Pt reports that he has been in treatment in the past. Pt states that he was at [facility] in [facility] Pt does not currently have insurance. He states that he did not have insurance when he went to [facility]. Discussed pt's ETOH use with him. He states that he knows he needs to cut down or quit. Pt states, "I know it's up to me". Provided pt with verbal and written information on the effects of alcohol on his body as well as AA and outpatient counseling information. Pt states that he knows how to contact [facility] for treatment if he wants to go that direction. Pt states that his mother and other supportive people in his life would like to see him stop drinking. Pt verbalizes that he intends to try and stop. Support and encouragement provided.  Employment status:   [employer] information:  Self Pay (Medicaid Pending) PT Recommendations:  Not assessed at this time Information / Referral to community resources:  Outpatient Substance Abuse Treatment Options  Patient/Family's Response to care: Pt accepting of CSW care.  Patient/Family's Understanding of and Emotional Response to Diagnosis, Current Treatment, and Prognosis: Pt appears to have a good understanding of diagnosis and treatment recommendations. Pt did not present with any emotional distress. Pt encouraged to follow up with AODA treatment.  Emotional Assessment Appearance:  Appears stated age Attitude/Demeanor/Rapport:  Engaged Affect (typically observed):  Calm, Pleasant, Quiet Orientation:  Oriented to Self, Oriented to Place, Oriented to  Time, Oriented to Situation Alcohol / Substance use:  Alcohol Use Psych involvement (Current and /or in the community):  No (Comment)  Discharge Needs  Concerns to be addressed:  Substance Abuse Concerns Readmission within the last 30 days:  No Current discharge risk:  Substance Abuse Barriers to Discharge:  No Barriers Identified   Shade Flood, LCSW 01/09/2018, 11:02 AM

## 2018-01-09 NOTE — Discharge Instructions (Signed)
Do not drive or operate any machinery or motor vehicles until you have followed up with the neurologist and given clearance that you can do so. Please avoid all alcohol and recreational drugs.  Please go to the alcohol treatment resources that was provided for you.  Please follow-up on EEG results with neurologist in the next 1 to 2 weeks.  Please continue to take Keppra until you followed up with the neurologist.   Follow with Primary MD  Patient, No Pcp Per  and other consultant's as instructed your Hospitalist MD  Please get a complete blood count and chemistry panel checked by your Primary MD at your next visit, and again as instructed by your Primary MD.  Get Medicines reviewed and adjusted: Please take all your medications with you for your next visit with your Primary MD  Laboratory/radiological data: Please request your Primary MD to go over all hospital tests and procedure/radiological results at the follow up, please ask your Primary MD to get all Hospital records sent to his/her office.  In some cases, they will be blood work, cultures and biopsy results pending at the time of your discharge. Please request that your primary care M.D. follows up on these results.  Also Note the following: If you experience worsening of your admission symptoms, develop shortness of breath, life threatening emergency, suicidal or homicidal thoughts you must seek medical attention immediately by calling 911 or calling your MD immediately  if symptoms less severe.  You must read complete instructions/literature along with all the possible adverse reactions/side effects for all the Medicines you take and that have been prescribed to you. Take any new Medicines after you have completely understood and accpet all the possible adverse reactions/side effects.   Do not drive when taking Pain medications or sleeping medications (Benzodaizepines)  Do not take more than prescribed Pain, Sleep and Anxiety  Medications. It is not advisable to combine anxiety,sleep and pain medications without talking with your primary care practitioner  Special Instructions: If you have smoked or chewed Tobacco  in the last 2 yrs please stop smoking, stop any regular Alcohol  and or any Recreational drug use.  Wear Seat belts while driving.  Please note: You were cared for by a hospitalist during your hospital stay. Once you are discharged, your primary care physician will handle any further medical issues. Please note that NO REFILLS for any discharge medications will be authorized once you are discharged, as it is imperative that you return to your primary care physician (or establish a relationship with a primary care physician if you do not have one) for your post hospital discharge needs so that they can reassess your need for medications and monitor your lab values.     Seizure, Adult A seizure is a sudden burst of abnormal electrical activity in the brain. The abnormal activity temporarily interrupts normal brain function, causing a person to experience any of the following:  Involuntary movements.  Changes in awareness or consciousness.  Uncontrollable shaking (convulsions).  Seizures usually last from 30 seconds to 2 minutes. They usually do not cause permanent brain damage unless they are prolonged. What can cause a seizure to happen? Seizures can happen for many reasons including:  A fever.  Low blood sugar.  A medicine.  An illnesses.  A brain injury.  Some people who have a seizure never have another one. People who have repeated seizures have a condition called epilepsy. What are the symptoms of a seizure? Symptoms of a seizure  vary greatly from person to person. They include:  Convulsions.  Stiffening of the body.  Involuntary movements of the arms or legs.  Loss of consciousness.  Breathing problems.  Falling suddenly.  Confusion.  Head nodding.  Eye blinking or  fluttering.  Lip smacking.  Drooling.  Rapid eye movements.  Grunting.  Loss of bladder control and bowel control.  Staring.  Unresponsiveness.  Some people have symptoms right before a seizure happens (aura) and right after a seizure happens. Symptoms of an aura include:  Fear or anxiety.  Nausea.  Feeling like the room is spinning (vertigo).  A feeling of having seen or heard something before (deja vu).  Odd tastes or smells.  Changes in vision, such as seeing flashing lights or spots.  Symptoms that may follow a seizure include:  Confusion.  Sleepiness.  Headache.  Weakness of one side of the body.  Follow these instructions at home: Medicines   Take over-the-counter and prescription medicines only as told by your health care provider.  Avoid any substances that may prevent your medicine from working properly, such as alcohol. Activity  Do not drive, swim, or do any other activities that would be dangerous if you had another seizure. Wait until your health care provider approves.  If you live in the [place] check with your local DMV (department of motor vehicles) to find out about the local driving laws. Each state has specific rules about when you can legally return to driving.  Get enough rest. Lack of sleep can make seizures more likely to occur. Educating others Teach friends and family what to do if you have a seizure. They should:  Lay you on the ground to prevent a fall.  Cushion your head and body.  Loosen any tight clothing around your neck.  Turn you on your side. If vomiting occurs, this helps keep your airway clear.  Stay with you until you recover.  Not hold you down. Holding you down will not stop the seizure.  Not put anything in your mouth.  Know whether or not you need emergency care.  General instructions  Contact your health care provider each time you have a seizure.  Avoid anything that has ever triggered a seizure for  you.  Keep a seizure diary. Record what you remember about each seizure, especially anything that might have triggered the seizure.  Keep all follow-up visits as told by your health care provider. This is important. Contact a health care provider if:  You have another seizure.  You have seizures more often.  Your seizure symptoms change.  You continue to have seizures with treatment.  You have symptoms of an infection or illness. They might increase your risk of having a seizure. Get help right away if:  You have a seizure: ? That lasts longer than 5 minutes. ? That is different than previous seizures. ? That leaves you unable to speak or use a part of your body. ? That makes it harder to breathe. ? After a head injury.  You have: ? Multiple seizures in a row. ? Confusion or a severe headache right after a seizure.  You are having seizures more often.  You do not wake up immediately after a seizure.  You injure yourself during a seizure. These symptoms may represent a serious problem that is an emergency. Do not wait to see if the symptoms will go away. Get medical help right away. Call your local emergency services (911 in the [place] Do not  drive yourself to the hospital. This information is not intended to replace advice given to you by your health care provider. Make sure you discuss any questions you have with your health care provider. Document Released: 08/30/2000 Document Revised: 04/28/2016 Document Reviewed: 04/05/2016 Elsevier Interactive Patient Education  [employer]2018 [employer] Inc[employer]

## 2018-01-09 NOTE — Discharge Summary (Signed)
Physician Discharge Summary  Jimmy JimLyshod J Skilling [id number] DOB: 01/15/1991 DOA: 01/07/2018  PCP: Patient, No Pcp Per Neurologist: Dr. Gerilyn Pilgrimoonquah  Admit date: 01/07/2018 Discharge date: 01/09/2018  Admitted From: Home  Disposition: HOME  Recommendations for Outpatient Follow-up:  1. Follow up with PCP in 1 weeks 2. Follow up with neurologist in 2 weeks  Discharge Condition: STABLE   CODE STATUS: FULL    Brief Hospitalization Summary: Please see all hospital notes, images, labs for full details of the hospitalization. HPI: Jimmy Castro is a 27 y.o. male with medical history significant for alcohol dependence with prior alcohol withdrawal seizures, hypertension, elevated LFTs, and thrombocytopenia who presents to the emergency department today on account of loss of consciousness while he was at work today.  He states that he was dehydrated and got quite hot and therefore was brought to the ED for evaluation.  He states that he had alcohol earlier this morning. No recent illness, fever, chills, sweats, or headache noted.   ED Course: In the ED, his vital signs were stable and he was started on 2 L of normal saline bolus for hydration and then was noted to have a tonic-clonic seizure for which he was given IV Ativan as well as 500 mg of IV Keppra.  He then became quite sleepy and postictal.  He is currently noted to have an elevated serum alcohol level.  He is also noted to have elevated LFTs with AST 364 and ALT 136.  UDS is positive for THC.  I have ordered head CT which is negative for any acute findings.  He is currently quite somnolent but arousable to voice.  Brief Admission Hx: 26 y.o.malewith medical history significant foralcohol dependence with prior alcohol withdrawal seizures, hypertension, elevated LFTs, and thrombocytopenia who presents to the emergency department today on account of loss of consciousness while he was at work today. He states that he was dehydrated and got quite  hot and therefore was brought to the ED for evaluation. He states that he had alcohol earlier this morning.  MDM/Assessment & Plan:   1. Seizure. Previously noted to be related to alcohol withdrawal and suspect that this is similar as he had recently cut down on his drinking prior to this event.  He has a history of delirium tremens and was monitored in the SDU. CT head was ordered with no acute findings.  MRI brain no acute findings. Chronic sinusitis. He had been given Ativan and Keppra IV. He was monitored in the stepdown unit and remained stable.  The patient had an EEG prior to discharge and will follow up with Dr. Gerilyn Pilgrimoonquah in neurology for outpatient follow-up.  And to obtain the results. 2. Syncope and collapse. This is likely related to dehydration and chronic alcoholism.  Continue IVF hydration.  He has been supplemented with additional vitamins and was given instructions to continue folic acid, thiamine and multivitamins. 3. Severe alcohol dependence. Pt endorses being a really heavy alcohol consumer.  He was maintained on CIWA protocol. He was counseled by the Child psychotherapistsocial worker and given resources for alcohol treatment.  The patient says that he is going to seek treatment. 4. Transaminitis. This is likely due to his chronic heavy alcoholism. Right upper quadrant ultrasound negative . Cannabinoid use.  The patient was counseled to avoid recreational drug use.  DVT prophylaxis:Lovenox Code Status:Full Family Communication:None at bedside Disposition Plan:Home Consults called:Neurology incomputer Admission status:Inpatient, stepdown unit  Consultants:  neurology  Discharge Diagnoses:  Principal Problem:   Seizure (  HCC) Active Problems:   Hypertension   Severe alcohol dependence (HCC)   Transaminitis   Syncope and collapse  Discharge Instructions: Discharge Instructions    Call MD for:  difficulty breathing, headache or visual disturbances   Complete by:  As  directed    Call MD for:  extreme fatigue   Complete by:  As directed    Call MD for:  persistant dizziness or light-headedness   Complete by:  As directed    Call MD for:  persistant nausea and vomiting   Complete by:  As directed    Call MD for:  redness, tenderness, or signs of infection (pain, swelling, redness, odor or green/yellow discharge around incision site)   Complete by:  As directed    Diet - low sodium heart healthy   Complete by:  As directed    Increase activity slowly   Complete by:  As directed      Allergies as of 01/09/2018   No Known Allergies     Medication List    TAKE these medications   BIOTIN PO Take 1 capsule by mouth daily.   folic acid 1 MG tablet Commonly known as:  FOLVITE Take 1 tablet (1 mg total) by mouth daily.   levETIRAcetam 500 MG tablet Commonly known as:  KEPPRA Take 1 tablet (500 mg total) by mouth 2 (two) times daily.   multivitamin with minerals Tabs tablet Take 1 tablet by mouth daily.   thiamine 100 MG tablet Take 1 tablet (100 mg total) by mouth daily.   vitamin C 500 MG tablet Commonly known as:  ASCORBIC ACID Take 500 mg by mouth daily.      Follow-up Information    Beryle Beams, MD. Schedule an appointment as soon as possible for a visit in 2 week(s).   Specialty:  Neurology Why:  Hospital Follow Up Seizures Contact information: [address] [place] [facility] [address] [phone]        Aliene Beams, MD. Schedule an appointment as soon as possible for a visit in 2 week(s).   Specialty:  Family Medicine Why:  Establish Care for Primary Care Physician Contact information: [address] [place] [address] [place] [address] [phone]          No Known Allergies Allergies as of 01/09/2018   No Known Allergies     Medication List    TAKE these medications   BIOTIN PO Take 1 capsule by mouth daily.   folic acid 1 MG tablet Commonly known as:  FOLVITE Take 1 tablet (1 mg total) by mouth  daily.   levETIRAcetam 500 MG tablet Commonly known as:  KEPPRA Take 1 tablet (500 mg total) by mouth 2 (two) times daily.   multivitamin with minerals Tabs tablet Take 1 tablet by mouth daily.   thiamine 100 MG tablet Take 1 tablet (100 mg total) by mouth daily.   vitamin C 500 MG tablet Commonly known as:  ASCORBIC ACID Take 500 mg by mouth daily.       Procedures/Studies: Ct Head Wo Contrast  Result Date: 01/07/2018 CLINICAL DATA:  27 y/o M; recent seizure. Patient recently stopped drinking. EXAM: CT HEAD WITHOUT CONTRAST TECHNIQUE: Contiguous axial images were obtained from the base of the skull through the vertex without intravenous contrast. COMPARISON:  08/15/2017 CT of the head. FINDINGS: Brain: No evidence of acute infarction, hemorrhage, hydrocephalus, extra-axial collection or mass lesion/mass effect. Vascular: No hyperdense vessel or unexpected calcification. Skull: Normal. Negative for fracture or  focal lesion. Sinuses/Orbits: Partial opacification of left greater than right ethmoid air cells. Otherwise negative. Other: None. IMPRESSION: No acute intracranial abnormality.  Stable negative CT of the brain. Electronically Signed   By: Mitzi Hansen M.D.   On: 01/07/2018 22:46   Mr Brain Wo Contrast  Result Date: 01/08/2018 CLINICAL DATA:  Headache two days after seizure. EXAM: MRI HEAD WITHOUT CONTRAST TECHNIQUE: Multiplanar, multiecho pulse sequences of the brain and surrounding structures were obtained without intravenous contrast. COMPARISON:  CT head 01/07/2018. FINDINGS: Brain: No evidence for acute infarction, hemorrhage, mass lesion, hydrocephalus, or extra-axial fluid. Premature for age atrophy. No significant white matter disease. Thin-section coronal images through the temporal lobes demonstrate no destructive process, mass lesion, or inflammation. Vascular: Flow voids are maintained throughout the carotid, basilar, and vertebral arteries. There are no areas  of chronic hemorrhage. Skull and upper cervical spine: Normal marrow signal. Sinuses/Orbits: Large retention cyst RIGHT maxillary sinus. Moderate opacity of multiple LEFT greater than RIGHT ethmoid air cells. No orbital findings of significance. Other: None. IMPRESSION: Premature for age atrophy.  No acute intracranial findings. Chronic sinusitis. Electronically Signed   By: Elsie Stain M.D.   On: 01/08/2018 11:23   US Abdomen Complete  Result Date: 01/08/2018 CLINICAL DATA:  Elevated LFTs EXAM: ABDOMEN ULTRASOUND COMPLETE COMPARISON:  None. FINDINGS: Gallbladder: No gallstones or wall thickening visualized. No sonographic Murphy sign noted by sonographer. Common bile duct: Diameter: Normal caliber, 4 mm Liver: No focal lesion identified. Within normal limits in parenchymal echogenicity. Portal vein is patent on color Doppler imaging with normal direction of blood flow towards the liver. IVC: No abnormality visualized. Pancreas: Visualized portion unremarkable. Spleen: Size and appearance within normal limits. Right Kidney: Length: 11.6 cm. Echogenicity within normal limits. No mass or hydronephrosis visualized. Left Kidney: Length: 10.7 cm. Echogenicity within normal limits. No mass or hydronephrosis visualized. Abdominal aorta: No aneurysm visualized. Other findings: None. IMPRESSION: Unremarkable abdominal ultrasound. Electronically Signed   By: Charlett Nose M.D.   On: 01/08/2018 09:39      Subjective: Pt says that he has to discharge home, he has things to do, says he will seek treatment, spoke with social worker, no complaints. No further seizure activities.   Discharge Exam: Vitals:   01/09/18 0600 01/09/18 0737  BP: (!) 130/98   Pulse: 68 66  Resp: 19 19  Temp:  97.6 F (36.4 C)  SpO2: 99% 98%   Vitals:   01/09/18 0500 01/09/18 0516 01/09/18 0600 01/09/18 0737  BP: 131/90  (!) 130/98   Pulse: 63  68 66  Resp: 20  19 19   Temp:    97.6 F (36.4 C)  TempSrc:    Axillary  SpO2: 99%   99% 98%  Weight:  55.1 kg (121 lb 7.6 oz)    Height:        General: Pt is alert, awake, not in acute distress Cardiovascular: RRR, S1/S2 +, no rubs, no gallops Respiratory: CTA bilaterally, no wheezing, no rhonchi Abdominal: Soft, NT, ND, bowel sounds + Extremities: no edema, no cyanosis Neurological: nonfocal.    The results of significant diagnostics from this hospitalization (including imaging, microbiology, ancillary and laboratory) are listed below for reference.     Microbiology: Recent Results (from the past 240 hour(s))  MRSA PCR Screening     Status: None   Collection Time: 01/08/18 12:47 AM  Result Value Ref Range Status   MRSA by PCR NEGATIVE NEGATIVE Final    Comment:  The GeneXpert MRSA Assay (FDA approved for NASAL specimens only), is one component of a comprehensive MRSA colonization surveillance program. It is not intended to diagnose MRSA infection nor to guide or monitor treatment for MRSA infections. Performed at [facility], [address], [place], [place] [address]      Labs: BNP (last 3 results) No results for input(s): BNP in the last 8760 hours. Basic Metabolic Panel: Recent Labs  Lab 01/07/18 1910 01/08/18 0411 01/08/18 0927 01/09/18 0441  NA 135 136  --  137  K 3.5 3.3*  --  3.2*  CL 98* 103  --  105  CO2 15* 18*  --  21*  GLUCOSE 90 65  --  112*  BUN 5* <5*  --  <5*  CREATININE 0.89 0.61  --  0.58*  CALCIUM 9.4 9.0  --  9.1  MG  --   --  2.0 1.7   Liver Function Tests: Recent Labs  Lab 01/07/18 1910 01/08/18 0411 01/09/18 0441  AST 364* 264* 181*  ALT 136* 105* 92*  ALKPHOS 221* 168* 143*  BILITOT 1.3* 2.0* 1.3*  PROT 8.6* 6.8 6.3*  ALBUMIN 4.7 3.8 3.5   No results for input(s): LIPASE, AMYLASE in the last 168 hours. No results for input(s): AMMONIA in the last 168 hours. CBC: Recent Labs  Lab 01/07/18 1910 01/08/18 0411 01/09/18 0441  WBC 6.3 5.3 6.2  NEUTROABS 5.4  --  4.7  HGB 14.4 12.3* 12.1*  HCT  40.2 34.7* 34.6*  MCV 98.0 98.0 98.6  PLT 176 150 150   Cardiac Enzymes: No results for input(s): CKTOTAL, CKMB, CKMBINDEX, TROPONINI in the last 168 hours. BNP: Invalid input(s): POCBNP CBG: Recent Labs  Lab 01/07/18 1825  GLUCAP 120*   D-Dimer No results for input(s): DDIMER in the last 72 hours. Hgb A1c No results for input(s): HGBA1C in the last 72 hours. Lipid Profile Recent Labs    01/08/18 0927  CHOL 235*  HDL 134  LDLCALC 90  TRIG 55  CHOLHDL 1.8   Thyroid function studies Recent Labs    01/08/18 0927  TSH 1.666   Anemia work up Recent Labs    01/08/18 0927  VITAMINB12 1,187*   Urinalysis    Component Value Date/Time   COLORURINE YELLOW 01/07/2018 2010   APPEARANCEUR CLEAR 01/07/2018 2010   LABSPEC 1.011 01/07/2018 2010   PHURINE 6.0 01/07/2018 2010   GLUCOSEU NEGATIVE 01/07/2018 2010   HGBUR SMALL (A) 01/07/2018 2010   BILIRUBINUR NEGATIVE 01/07/2018 2010   KETONESUR 5 (A) 01/07/2018 2010   PROTEINUR 100 (A) 01/07/2018 2010   NITRITE NEGATIVE 01/07/2018 2010   LEUKOCYTESUR NEGATIVE 01/07/2018 2010   Sepsis Labs Invalid input(s): PROCALCITONIN,  WBC,  LACTICIDVEN Microbiology Recent Results (from the past 240 hour(s))  MRSA PCR Screening     Status: None   Collection Time: 01/08/18 12:47 AM  Result Value Ref Range Status   MRSA by PCR NEGATIVE NEGATIVE Final    Comment:        The GeneXpert MRSA Assay (FDA approved for NASAL specimens only), is one component of a comprehensive MRSA colonization surveillance program. It is not intended to diagnose MRSA infection nor to guide or monitor treatment for MRSA infections. Performed at [facility], [address] [place], [place] [address]    Time coordinating discharge: 33 mins  SIGNED:  Standley Dakins, MD  [facility] 01/09/2018, 8:42 AM Pager [phone]  If 7PM-7AM, please contact night-coverage [email or web address] Password [email or web address]

## 2018-01-09 NOTE — Care Management Note (Signed)
Case Management Note  Patient Details  Name: Jimmy Castro MRN: [id number] Date of Birth: 1991/06/30    Expected Discharge Date:  01/09/18               Expected Discharge Plan:  Home/Self Care  In-House Referral:  PCP / [facility]  Discharge planning Services  CM Consult, MATCH Program, Medication Assistance  Post Acute Care Choice:    Choice offered to:  Patient, Parent  DME Arranged:    DME Agency:     HH Arranged:    HH Agency:     Status of Service:  Completed, signed off  If discussed at [employer] Length of Stay Meetings, dates discussed:    Additional Comments: Patient discharging home today. MATCH given along with coupon for [facility] for future prescription needs. PCP list given. Patient AVS instructs patient to f/u and make appt. With PCP and neurology.   Manasa Spease, Chrystine OilerSharley Diane, RN 01/09/2018, 10:36 AM

## 2018-02-23 ENCOUNTER — Emergency Department (HOSPITAL_COMMUNITY)
Admission: EM | Admit: 2018-02-23 | Discharge: 2018-02-23 | Disposition: A | Discharge status: Home / Self Care | Payer: Self-pay

## 2018-09-07 ENCOUNTER — Emergency Department (HOSPITAL_COMMUNITY): Admission: EM | Admit: 2018-09-07 | Discharge: 2018-09-08 | Discharge status: Against Medical Advice | Payer: Self-pay

## 2018-10-29 IMAGING — CT CT HEAD W/O CM
5 of 7 series · 17 of 47 positions shown, 18 images · non-contrast
Comparison: 05/10/2010, 07/14/2011

CLINICAL DATA: Seizure neck pain headache

EXAM:
CT HEAD WITHOUT CONTRAST
CT CERVICAL SPINE WITHOUT CONTRAST
TECHNIQUE: Multidetector CT imaging of the head and cervical spine was
performed following the standard protocol without intravenous
contrast. Multiplanar CT image reconstructions of the cervical spine
were also generated.

[Series 3: head wo · axial · 0.43mm/px · z∈[+336,+416]mm · 3 of 33 slices shown, 4 images]
[im 9/33  brain]
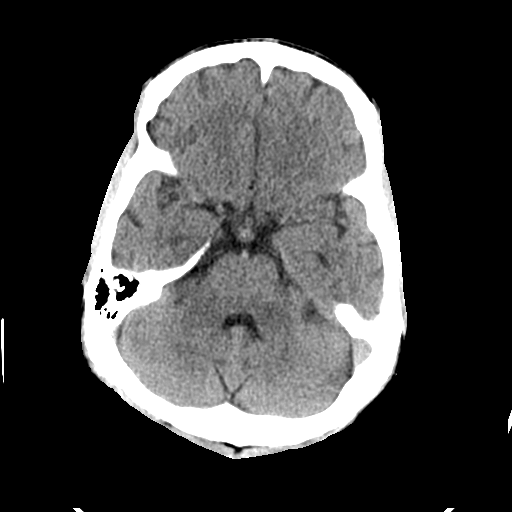
[im 9/33  bone]
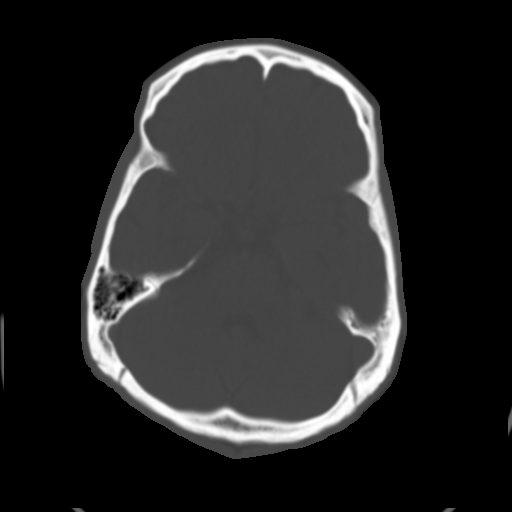
[im 17/33  brain]
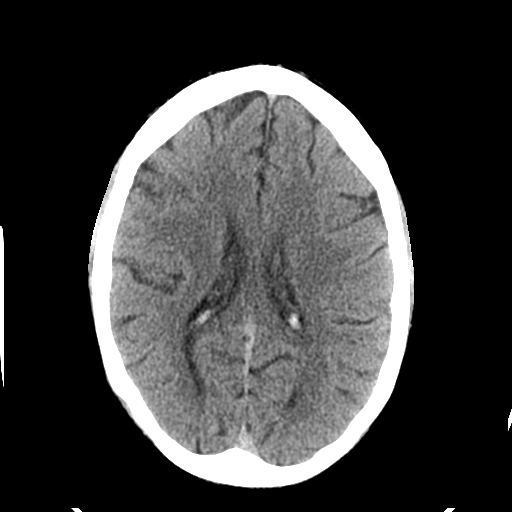
[im 25/33  brain]
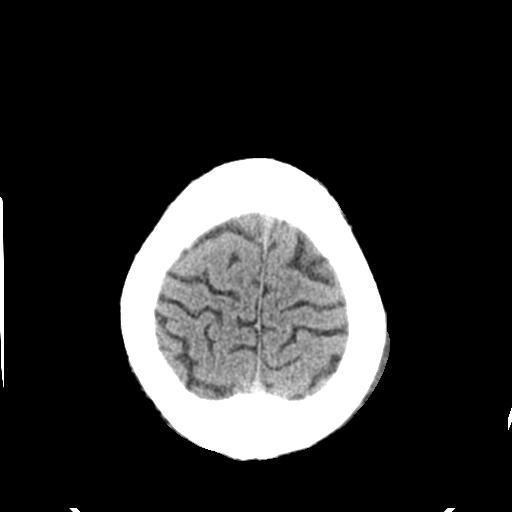

[Series 5: coronal soft tissue · coronal · 0.32mm/px · 3 of 70 slices shown]
[im 14/70  brain]
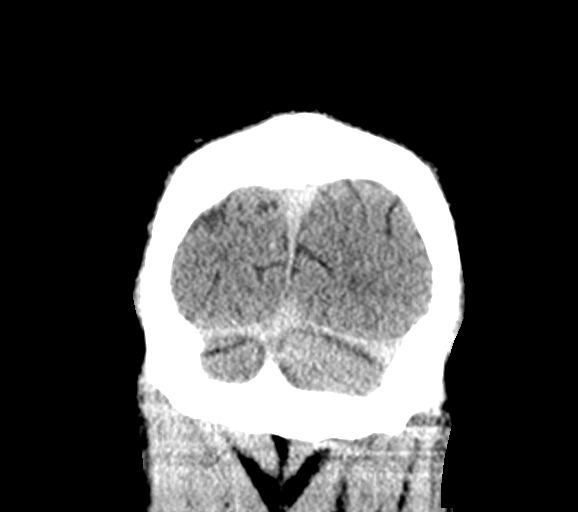
[im 28/70  brain]
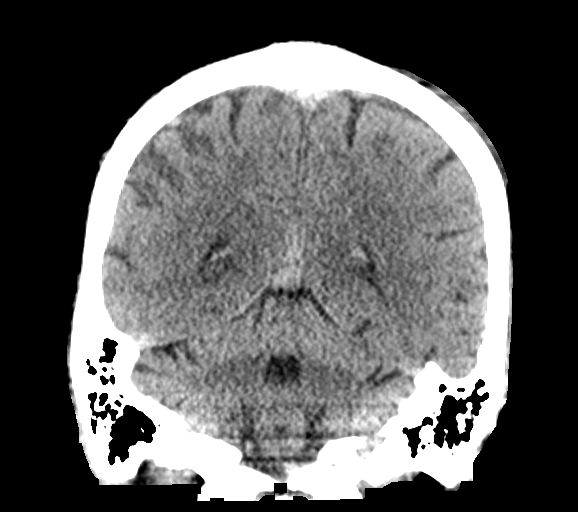
[im 42/70  brain]
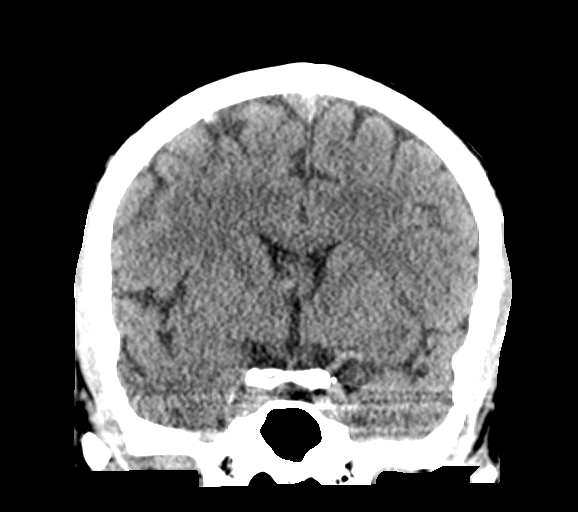

[Series 6: sagittal soft tissue · sagittal · 0.34mm/px · 1 of 59 slices shown]
[im 30/59  brain]
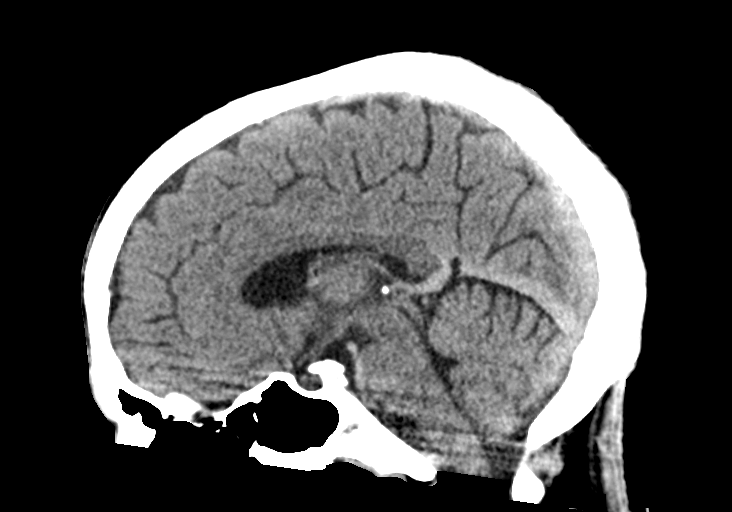

[Series 8: c spine soft · axial · 0.26mm/px · z∈[+130,+162]mm · 2 of 96 slices shown]
[im 8/96  brain]
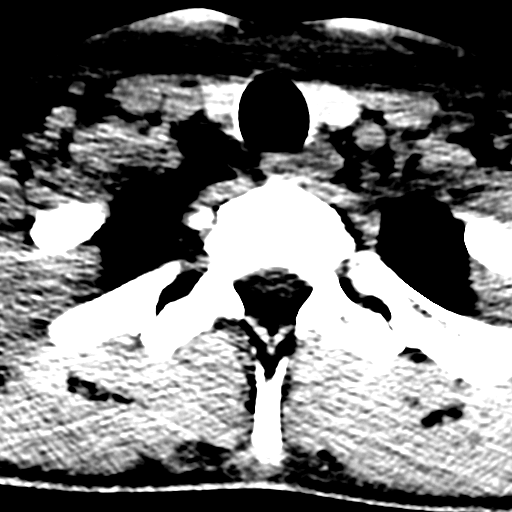
[im 24/96  brain]
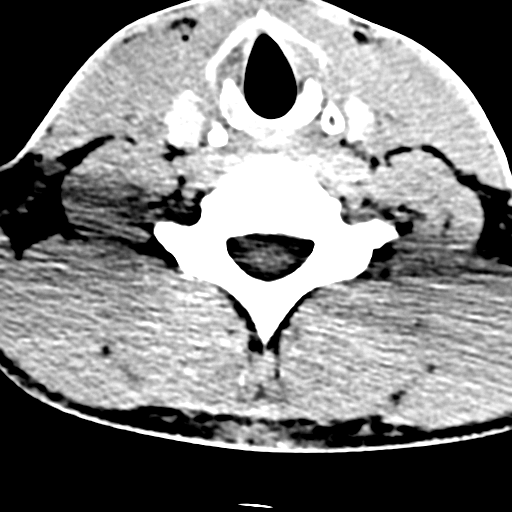

[Series 11: orthogonal bone · axial · 0.21mm/px · z∈[+114,+254]mm · 8 of 90 slices shown]
[im 9/90  bone]
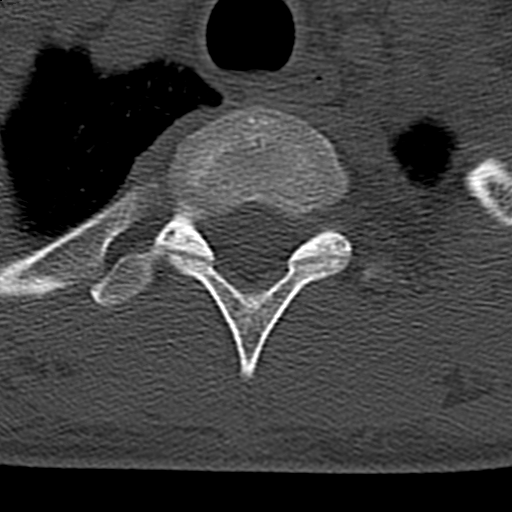
[im 17/90  bone]
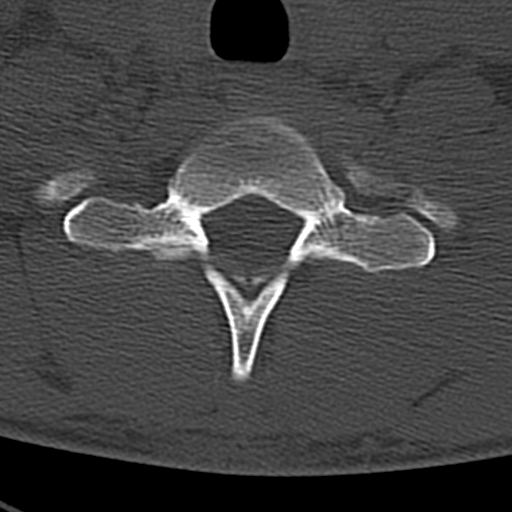
[im 33/90  bone]
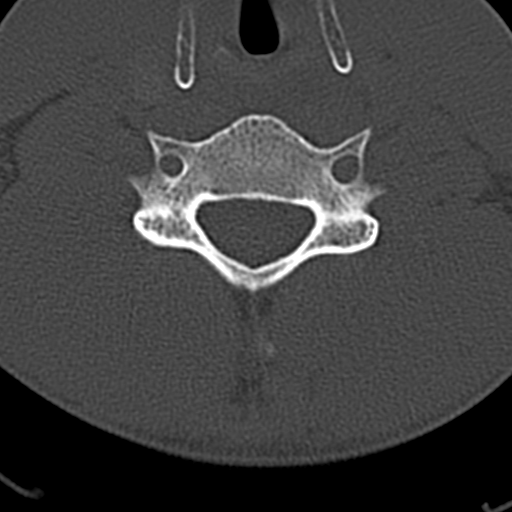
[im 41/90  bone]
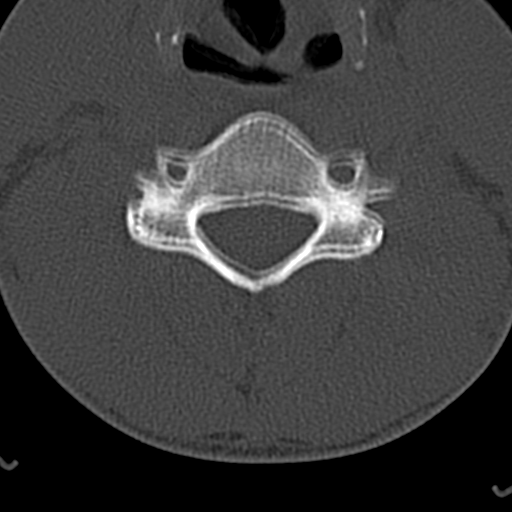
[im 49/90  bone]
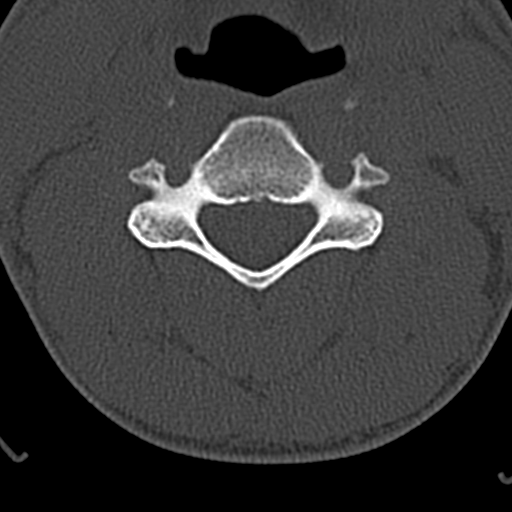
[im 57/90  bone]
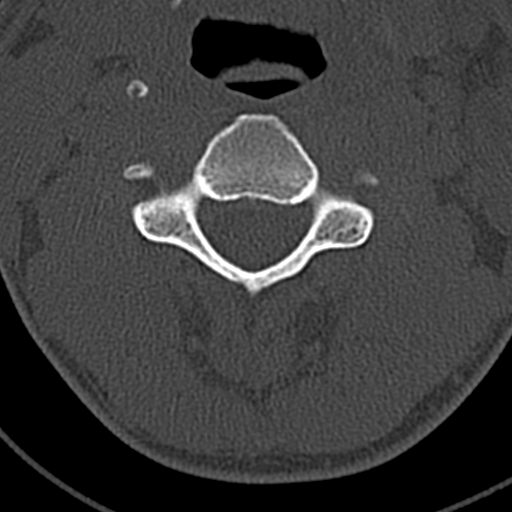
[im 73/90  bone]
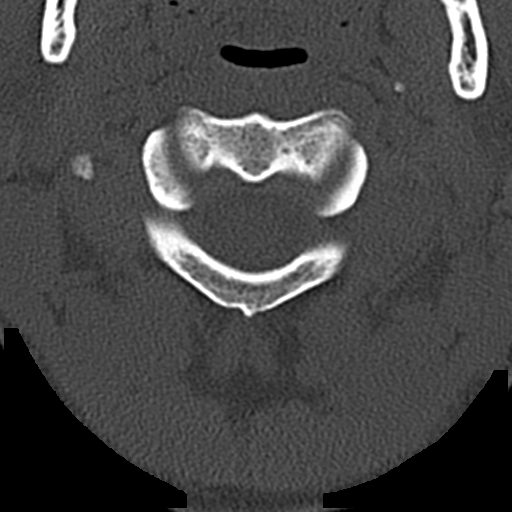
[im 81/90  bone]
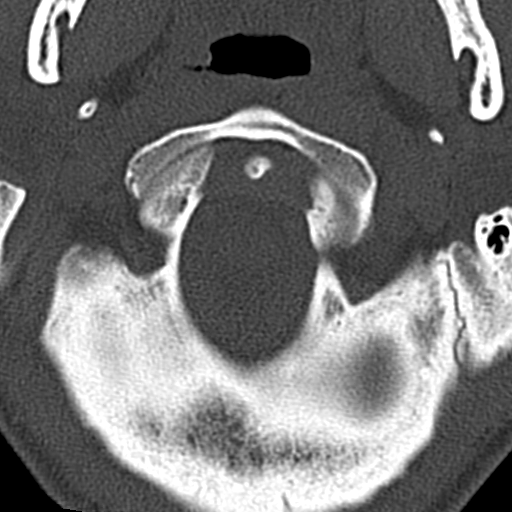

[17 of 47 positions shown; findings below may reference images not displayed]

FINDINGS: CT HEAD FINDINGS

Brain: No acute territorial infarction, hemorrhage or intracranial
mass is visualized. The ventricles are nonenlarged.

Vascular: No hyperdense vessel or unexpected calcification.

Skull: Normal. Negative for fracture or focal lesion.

Sinuses/Orbits: Mucosal thickening in the ethmoid sinuses. No acute
orbital abnormality

Other: Moderate left parietal scalp soft tissue swelling.

CT CERVICAL SPINE FINDINGS

Alignment: Mild reversal of cervical lordosis. No subluxation. Facet
alignment within normal limits

Skull base and vertebrae: No acute fracture. No primary bone lesion
or focal pathologic process. Stable calcification posterior to the
tip of the dens.

Soft tissues and spinal canal: No prevertebral fluid or swelling. No
visible canal hematoma.

Disc levels: No significant disc space narrowing. Foramen are patent
bilaterally

Upper chest: Negative.

Other: None
IMPRESSION: 1. No CT evidence for acute intracranial abnormality. Moderate left
parietal scalp soft tissue swelling
2. Reversal of cervical lordosis.  No acute fracture.

## 2019-07-07 ENCOUNTER — Emergency Department (HOSPITAL_COMMUNITY)
Admission: EM | Admit: 2019-07-07 | Discharge: 2019-07-08 | Disposition: A | Discharge status: Home / Self Care | Payer: Self-pay | Attending: Emergency Medicine | Admitting: Emergency Medicine

## 2019-07-07 ENCOUNTER — Encounter (HOSPITAL_COMMUNITY): Payer: Self-pay | Admitting: Emergency Medicine

## 2019-07-07 ENCOUNTER — Other Ambulatory Visit: Payer: Self-pay

## 2019-07-07 DIAGNOSIS — F10930 Alcohol use, unspecified with withdrawal, uncomplicated: Secondary | ICD-10-CM

## 2019-07-07 DIAGNOSIS — F10230 Alcohol dependence with withdrawal, uncomplicated: Secondary | ICD-10-CM | POA: Insufficient documentation

## 2019-07-07 DIAGNOSIS — Z87891 Personal history of nicotine dependence: Secondary | ICD-10-CM | POA: Insufficient documentation

## 2019-07-07 DIAGNOSIS — I10 Essential (primary) hypertension: Secondary | ICD-10-CM | POA: Insufficient documentation

## 2019-07-07 DIAGNOSIS — Z79899 Other long term (current) drug therapy: Secondary | ICD-10-CM | POA: Insufficient documentation

## 2019-07-07 DIAGNOSIS — Y903 Blood alcohol level of 60-79 mg/100 ml: Secondary | ICD-10-CM | POA: Insufficient documentation

## 2019-07-07 DIAGNOSIS — F1290 Cannabis use, unspecified, uncomplicated: Secondary | ICD-10-CM | POA: Insufficient documentation

## 2019-07-07 DIAGNOSIS — G252 Other specified forms of tremor: Secondary | ICD-10-CM | POA: Insufficient documentation

## 2019-07-07 LAB — COMPREHENSIVE METABOLIC PANEL
ALT: 57 U/L — ABNORMAL HIGH (ref 0–44)
AST: 146 U/L — ABNORMAL HIGH (ref 15–41)
Albumin: 4.4 g/dL (ref 3.5–5.0)
Alkaline Phosphatase: 106 U/L (ref 38–126)
Anion gap: 13 (ref 5–15)
BUN: <5 mg/dL — ABNORMAL LOW (ref 6–20)
CO2: 25 mmol/L (ref 22–32)
Calcium: 9.5 mg/dL (ref 8.9–10.3)
Chloride: 97 mmol/L — ABNORMAL LOW (ref 98–111)
Creatinine, Ser: 0.64 mg/dL (ref 0.61–1.24)
GFR calc Af Amer: >60 mL/min (ref >60)
GFR calc non Af Amer: >60 mL/min (ref >60)
Glucose, Bld: 166 mg/dL — ABNORMAL HIGH (ref 70–99)
Potassium: 3.3 mmol/L — ABNORMAL LOW (ref 3.5–5.1)
Sodium: 135 mmol/L (ref 135–145)
Total Bilirubin: 0.8 mg/dL (ref 0.3–1.2)
Total Protein: 7.8 g/dL (ref 6.5–8.1)

## 2019-07-07 LAB — RAPID URINE DRUG SCREEN, HOSP PERFORMED
Amphetamines: NOT DETECTED
Barbiturates: NOT DETECTED
Benzodiazepines: NOT DETECTED
Cocaine: NOT DETECTED
Opiates: NOT DETECTED
Tetrahydrocannabinol: POSITIVE — AB

## 2019-07-07 LAB — CBC
Hemoglobin: 15.5 g/dL (ref 13.0–17.0)
Platelets: 121 10*3/uL — ABNORMAL LOW (ref 150–400)
WBC: 5.4 10*3/uL (ref 4.0–10.5)

## 2019-07-07 LAB — ETHANOL: Alcohol, Ethyl (B): 60 mg/dL — ABNORMAL HIGH (ref <10)

## 2019-07-07 MED ORDER — SODIUM CHLORIDE 0.9 % IV BOLUS (SEPSIS)
1000.0000 mL | Freq: Once | INTRAVENOUS | Status: AC
  Administered 2019-07-07: 1000 mL via INTRAVENOUS

## 2019-07-07 MED ORDER — LORAZEPAM 2 MG/ML IJ SOLN
2.0000 mg | Freq: Once | INTRAMUSCULAR | Status: AC
  Administered 2019-07-07: 2 mg via INTRAVENOUS
  Filled 2019-07-07: qty 1

## 2019-07-07 MED ORDER — ONDANSETRON HCL 4 MG/2ML IJ SOLN
4.0000 mg | Freq: Once | INTRAMUSCULAR | Status: AC
  Administered 2019-07-07: 4 mg via INTRAVENOUS
  Filled 2019-07-07: qty 2

## 2019-07-07 NOTE — ED Triage Notes (Signed)
Patient reports withdrawal from alcoholism with bilateral arms tremors / shaking hands , last drink this evening , denies suicidal ideation or hallucinations , no fever /respirations unlabored.

## 2019-07-07 NOTE — ED Provider Notes (Signed)
[facility] EMERGENCY DEPARTMENT Provider Note   CSN: [id number] Arrival date & time: 07/07/19  2033     History   Chief Complaint Chief Complaint  Patient presents with  . Alcohol Withdrawal    HPI Jimmy Castro is a 28 y.o. male.     The history is provided by the patient and a parent.  Alcohol Problem This is a chronic problem. The current episode started more than 1 week ago. The problem occurs daily. The problem has been gradually worsening. Pertinent negatives include no chest pain, no abdominal pain, no headaches and no shortness of breath. Nothing aggravates the symptoms. Nothing relieves the symptoms.  Patient with long history of alcohol abuse. Patient reports he drinks close to 40 ounce beers or more a day This has been ongoing for up to a year. He reports today his only had one beer he feels that he is having withdrawal symptoms.  He reports nausea vomiting diarrhea, body aches, tremors, getting sweaty.  He reports this is similar to prior episodes of alcohol withdrawal.  No active seizures today, no falls.  He does have a previous history of alcohol withdrawal seizures  Past Medical History:  Diagnosis Date  . Alcohol abuse   . Hypertension     Patient Active Problem List   Diagnosis Date Noted  . Seizure (HCC) 01/07/2018  . Syncope and collapse 01/07/2018  . Hypertension 08/15/2017  . Severe alcohol dependence (HCC) 08/15/2017  . Alcohol withdrawal seizure (HCC) 08/15/2017  . Transaminitis 08/15/2017  . Thrombocytopenia (HCC) 08/15/2017    History reviewed. No pertinent surgical history.      Home Medications    Prior to Admission medications   Medication Sig Start Date End Date Taking? Authorizing Provider  BIOTIN PO Take 1 capsule by mouth daily.    [provider]  levETIRAcetam (KEPPRA) 500 MG tablet Take 1 tablet (500 mg total) by mouth 2 (two) times daily. 01/09/18 02/08/18  Johnson, Clanford L, MD  vitamin C  (ASCORBIC ACID) 500 MG tablet Take 500 mg by mouth daily.    [provider]    Family History Family History  Problem Relation Age of Onset  . Seizures Neg Hx     Social History Social History   Tobacco Use  . Smoking status: Former Smoker    Packs/day: 1.00    Types: Cigarettes  . Smokeless tobacco: Never Used  Substance Use Topics  . Alcohol use: Yes    Comment: occasional  . Drug use: No     Allergies   Patient has no known allergies.   Review of Systems Review of Systems  Constitutional: Positive for diaphoresis.  Respiratory: Negative for shortness of breath.   Cardiovascular: Negative for chest pain.  Gastrointestinal: Negative for abdominal pain.  Musculoskeletal: Positive for myalgias.  Neurological: Positive for tremors. Negative for seizures, syncope and headaches.  All other systems reviewed and are negative.    Physical Exam Updated Vital Signs BP (!) 155/117   Pulse 86   Temp 98.5 F (36.9 C) (Oral)   Resp 16   Ht 1.702 m (5\' 7" )   Wt 61.2 kg   SpO2 100%   BMI 21.14 kg/m   Physical Exam CONSTITUTIONAL: Mildly disheveled and anxious HEAD: Normocephalic/atraumatic EYES: EOMI/PERRL, no icterus ENMT: Mucous membranes moist NECK: supple no meningeal signs SPINE/BACK:entire spine nontender CV: S1/S2 noted, no murmurs/rubs/gallops noted LUNGS: Lungs are clear to auscultation bilaterally, no apparent distress ABDOMEN: soft, nontender, no rebound or  guarding, bowel sounds noted throughout abdomen GU:no cva tenderness NEURO: Pt is awake/alert/appropriate, moves all extremitiesx4.  No facial droop.  Tremor noted both hands EXTREMITIES: pulses normal/equal, full ROM SKIN: warm, color normal PSYCH: Anxious  ED Treatments / Results  Labs (all labs ordered are listed, but only abnormal results are displayed) Labs Reviewed  COMPREHENSIVE METABOLIC PANEL - Abnormal; Notable for the following components:      Result Value   Potassium 3.3  (*)    Chloride 97 (*)    Glucose, Bld 166 (*)    BUN <5 (*)    AST 146 (*)    ALT 57 (*)    All other components within normal limits  ETHANOL - Abnormal; Notable for the following components:   Alcohol, Ethyl (B) 60 (*)    All other components within normal limits  CBC - Abnormal; Notable for the following components:   Platelets 121 (*)    All other components within normal limits  RAPID URINE DRUG SCREEN, HOSP PERFORMED - Abnormal; Notable for the following components:   Tetrahydrocannabinol POSITIVE (*)    All other components within normal limits    EKG  ED ECG REPORT   Date: 07/07/2019 2338  Rate: 76  Rhythm: normal sinus rhythm  QRS Axis: normal  Intervals: normal  ST/T Wave abnormalities: early repolarization  Conduction Disutrbances:none  Narrative Interpretation:   Old EKG Reviewed: unchanged  I have personally reviewed the EKG tracing and agree with the computerized printout as noted.  Radiology No results found.  Procedures Procedures   Medications Ordered in ED Medications  LORazepam (ATIVAN) injection 2 mg (2 mg Intravenous Given 07/07/19 2335)  ondansetron (ZOFRAN) injection 4 mg (4 mg Intravenous Given 07/07/19 2335)  sodium chloride 0.9 % bolus 1,000 mL (0 mLs Intravenous Stopped 07/08/19 0056)     Initial Impression / Assessment and Plan / ED Course  I have reviewed the triage vital signs and the nursing notes.  Pertinent labs   results that were available during my care of the patient were reviewed by me and considered in my medical decision making (see chart for details).        11:31 PM Patient is an alcoholic with previous history of alcohol withdrawal seizures.  He was last admitted over a year ago for seizure activity.  He reports he is no longer on medications. He was given IV fluids and Ativan and reassess. We will also obtain a CIWA score 11:46 PM CIWA score = 6 Will treat and reassess 1:04 AM CIWA score improved  1:56 AM  Patient resting comfortably.  He feels improved.  Suspect very mild EtOH withdrawal He will be discharged home.  He will be staying with his mother we discussed strict ER return precautions.  Ativan taper has been given at discharge.  He has been given multiple outpatient resources to assist with his alcohol abuse.   Final Clinical Impressions(s) / ED Diagnoses   Final diagnoses:  Alcohol withdrawal syndrome without complication [facility])    ED Discharge Orders         Ordered    LORazepam (ATIVAN) 1 MG tablet     07/08/19 0150           Ripley Fraise, MD 07/08/19 0157

## 2019-07-08 MED ORDER — LORAZEPAM 1 MG PO TABS: ORAL_TABLET | ORAL | 0 refills | Status: AC

## 2019-07-08 NOTE — Discharge Instructions (Signed)
Substance Abuse Treatment Programs ° °Intensive Outpatient Programs °[facility]     °[address]      °[place], [place]                   °[phone]      ° °[facility] °[address] °[place], [place] °[phone] ° °[facility]     °(Inpatient and outpatient)     °[address]           °[phone]   ° °[facility] °[phone] (Suboxone and Methadone) ° °[address]      °[place], [place] [address]      °[phone]      ° °[address] °[place], [place] °852-3033 ° °[facility] (Outpatient/Inpatient, Chemical)    °(insurance only) [phone]      °       °[facility] (Groups & Residential) °[place], [place] °[phone] ° °   °[facility]     °[address]     °[place], [place]      °[phone]      ° °[facility] (for caregivers and family) °[address] °[place], [place] °336-299-4655 ° ° ° ° ° °Residential Treatment Programs °[facility]      °[address], [place], [place] [address]  °[phone]      ° °[facility] °[address] [place], [place] [address] °[phone] ° °[facility]        °[phone]      ° °[facility] °[phone] ° °[facility]             °[address]                                         °[place], [place]                                                °[phone] or 336-784-9470                              ° °[facility] [place] °[address] °[place], [address] °[phone] ° °[facility]    °[address]      °[place], [place]     °[phone]      ° °[facility] °[address] °[place], [place] °336-285-9073 ° °[facility]   °[address]     °[place], [place] [address]     °[phone]      °Admissions: 8am-3pm M-F ° °[facility] °[address] °[place],  [place] °336-227-7417 ° °[facility]: Residential Program (90 Days)   °[place], [place]      °336-725-8389 or 800-758-6077    ° °[facility] °[place], [place] °(Walk in Hours over the weekend or by referral) ° °[facility] °[address], [place], [place] [address] °([phone] ° °Crisis Mobile: [facility]:  1-877-626-1772 (for crisis response 24 hours a day) °[facility] Hotline:      1-800-256-2452 °Outpatient Psychiatry and Counseling ° °[facility]: Mobile Crisis   Management 24 hours:  1-877-626-1772 ° °[facility] sliding scale fee and walk in schedule: M-F 8am-12pm/1pm-3pm °[address]  °[place], [place] [address] °[phone] ° °[facility] °[address] °[place], [place] [address] °[phone] ° °[facility] (Formerly known [facility])- new patient walk-in appointments available Monday - Friday 8am -3pm.          °[address] °[place], [place] [address] °336-676-6840 or crisis line- 336-676-6905 ° °[facility]/ Intensive Outpatient Therapy Program °[address] °[place], [place] [address] °336-832-9804 ° °[facility]                  °Crisis Services      °[phone]      °[address]     °[place], [place] [address]                ° °[facility]   °[facility] °[phone] °[address] °[place], [place] [address] ° ° °[facility]          °[address]  °[place], [place] [address]       °[phone] ° °[facility] °[address] °[place], [place] [address] °336-292-1510 ° °[facility]    °[address]    °[place], [place] [address]     °[phone]      ° °Jimmy McKinney, Jimmy Castro     °[address]     °[place] [address]     °336-282-1251     °  °[facility] °[address] °[place] [address] ° °[facility]     °[address]     °[place], [place]      °336-542-2076      ° °[facility] °Jimmy Ahluwalia, Jimmy Castro °[address] °[place], [place] [address] °[phone] ° °[facility]     °[address]     °[place], [place] [address]     °336-274-1237      ° °[facility] °[address] °[place], [place] [address] °336-854-4450 °Resources for Temporary Residential Assistance/Crisis Centers ° °DAY CENTERS °[facility] °M-F 8am-3pm   °[address] [place], [place] [address]   336-332-0824 °Services include: laundry, barbering, support groups, case management, phone  & computer access, showers, AA/NA mtgs, mental health/substance abuse nurse, job skills class, disability information, VA assistance, spiritual classes, etc.  ° °HOMELESS SHELTERS ° °[facility]     °[facility]   °[address], [place]     °[phone]       °       °[facility] (women and children)       °[address] °[place], [place] [address] °336-275-0820 °[email or web address] for application and process °Application Required ° °[facility]   °[address]    °[place] [address]     °[phone]       °             °[facility] °[address] °, [place] [address] °336.273.5572 °336-235-0363(schedule application appt.) °Application Required ° °[facility] (women only)    °[address]     °[place], [place] [address]     °336-884-1039      °  Intake starts 6pm daily °Need valid ID, SSC, & Police report °[facility] [place] °[address] °[place], [place] °336-881-5420 °Application Required ° °[facility] (men only)     °[address]      °[place], [place]     °[phone]      ° °[facility] °(Pregnant women only) °[address] °[place], [place] °336-275-0206 ° °[facility]      °[address]      °[place], [place] [address]     °[phone]      °       °[facility] °[address] °[place], [place] °336-723-1848 °90 day commitment/SA/Application process ° °[facility](men only)     °[address]     °[place], [place]     °[phone]       °Check-in at 7pm     °       °[facility] °[address] °[place], [place] [address] °336-248-6684 °Men/Women/Women and Children must be there by 7 pm ° °[facility] °[place], [place] °336-722-8721                ° °

## 2023-01-06 ENCOUNTER — Emergency Department (HOSPITAL_COMMUNITY)
Admission: EM | Admit: 2023-01-06 | Discharge: 2023-01-07 | Disposition: A | Discharge status: Home / Self Care | Payer: 59 | Attending: Emergency Medicine | Admitting: Emergency Medicine

## 2023-01-06 ENCOUNTER — Encounter (HOSPITAL_COMMUNITY): Payer: Self-pay

## 2023-01-06 ENCOUNTER — Emergency Department (HOSPITAL_COMMUNITY): Payer: 59

## 2023-01-06 DIAGNOSIS — M25562 Pain in left knee: Secondary | ICD-10-CM | POA: Insufficient documentation

## 2023-01-06 DIAGNOSIS — I10 Essential (primary) hypertension: Secondary | ICD-10-CM | POA: Insufficient documentation

## 2023-01-06 DIAGNOSIS — F1020 Alcohol dependence, uncomplicated: Secondary | ICD-10-CM | POA: Diagnosis not present

## 2023-01-06 NOTE — ED Triage Notes (Signed)
Pt states that he has been having L knee pain for the past 2 days, denies injury

## 2023-01-07 ENCOUNTER — Encounter (HOSPITAL_COMMUNITY): Payer: Self-pay | Admitting: Student

## 2023-01-07 ENCOUNTER — Ambulatory Visit (HOSPITAL_BASED_OUTPATIENT_CLINIC_OR_DEPARTMENT_OTHER)
Admission: RE | Admit: 2023-01-07 | Discharge: 2023-01-07 | Disposition: A | Discharge status: Home / Self Care | Payer: 59 | Source: Ambulatory Visit | Attending: Student | Admitting: Student

## 2023-01-07 DIAGNOSIS — R52 Pain, unspecified: Secondary | ICD-10-CM | POA: Insufficient documentation

## 2023-01-07 DIAGNOSIS — M79605 Pain in left leg: Secondary | ICD-10-CM | POA: Diagnosis not present

## 2023-01-07 NOTE — Progress Notes (Signed)
Left lower extremity venous duplex has been completed. Preliminary results can be found in CV Proc through chart review.   01/07/23 12:00 PM Olen Cordial RVT

## 2023-01-07 NOTE — ED Provider Notes (Signed)
[place] EMERGENCY DEPARTMENT AT [facility] Provider Note   CSN: [id number] Arrival date & time: 01/06/23  2058     History  Chief Complaint  Patient presents with   Knee Pain    Jimmy Castro is a 32 y.o. male.  HPI   Patient with medical history including hypertension, past history of alcohol dependency, presenting with complaints of left knee pain.  States that started this morning, states he woke up and felt as if his knee locked up, now it he has continued pain in that knee, she worsened when he extends at the knee, states he feels pain on his popliteal as well as the medial and lateral aspects of the tibial plateau,  denies any injury to, never had this happen in the past.  He states location of some paresthesias moving down to his foot but this is since resolved.  He has no calf tenderness, he denies any history of PEs or DVTs currently not on hormone therapy, no recent surgeries, no long immobilizations.  Denies any fevers, denies any history of IV drug use.   Home Medications Prior to Admission medications   Medication Sig Start Date End Date Taking? Authorizing Provider  BIOTIN PO Take 1 capsule by mouth daily.    [provider]  levETIRAcetam (KEPPRA) 500 MG tablet Take 1 tablet (500 mg total) by mouth 2 (two) times daily. 01/09/18 02/08/18  Cleora Fleet, MD  LORazepam (ATIVAN) 1 MG tablet On day one take 3 tablets by mouth, on day 2 take 2 tablets by mouth, on day 1 take 1 tablet by mouth then STOP 07/08/19   Zadie Rhine, MD  vitamin C (ASCORBIC ACID) 500 MG tablet Take 500 mg by mouth daily.    [provider]      Allergies    Patient has no known allergies.    Review of Systems   Review of Systems  Constitutional:  Negative for chills and fever.  Respiratory:  Negative for shortness of breath.   Cardiovascular:  Negative for chest pain.  Gastrointestinal:  Negative for abdominal pain.  Musculoskeletal:        Left  knee.  Neurological:  Negative for headaches.    Physical Exam Updated Vital Signs BP (!) 153/115 (BP Location: Right Arm)   Pulse (!) 103   Temp 98.2 F (36.8 C) (Oral)   Resp 15   SpO2 98%  Physical Exam Vitals and nursing note reviewed.  Constitutional:      General: He is not in acute distress.    Appearance: He is not ill-appearing.  HENT:     Head: Normocephalic and atraumatic.     Nose: No congestion.  Eyes:     Conjunctiva/sclera: Conjunctivae normal.  Cardiovascular:     Rate and Rhythm: Normal rate and regular rhythm.     Pulses: Normal pulses.     Heart sounds: No murmur heard.    No friction rub. No gallop.  Pulmonary:     Effort: No respiratory distress.     Breath sounds: No wheezing, rhonchi or rales.  Musculoskeletal:     Right lower leg: No edema.     Left lower leg: No edema.     Comments: Focused exam of the lower extremities were unremarkable, no significant edema or erythema no deformities present, moving at his toes ankle knee without difficulty bilaterally, patient minimal tenderness noted around his popliteal without calf tenderness or palpable cords.  Sensation tact light touch, 2-second  capillary refill.  Skin:    General: Skin is warm and dry.  Neurological:     Mental Status: He is alert.  Psychiatric:        Mood and Affect: Mood normal.     ED Results / Procedures / Treatments   Labs (all labs ordered are listed, but only abnormal results are displayed) Labs Reviewed - No data to display  EKG None  Radiology DG Knee Complete 4 Views Left  Result Date: 01/06/2023 CLINICAL DATA:  Left knee pain EXAM: LEFT KNEE - COMPLETE 4+ VIEW COMPARISON:  None Available. FINDINGS: No evidence of fracture, dislocation, or joint effusion. No evidence of arthropathy or other focal bone abnormality. Soft tissues are unremarkable. IMPRESSION: Negative. Electronically Signed   By: Helyn Numbers M.D.   On: 01/06/2023 21:55    Procedures Procedures     Medications Ordered in ED Medications - No data to display  ED Course/ Medical Decision Making/ A&P                             Medical Decision Making Amount and/or Complexity of Data Reviewed Radiology: ordered.   This patient presents to the ED for concern of left knee pain, this involves an extensive number of treatment options, and is a complaint that carries with it a high risk of complications and morbidity.  The differential diagnosis includes fracture, dislocation, compartment syndrome, DVT,    Additional history obtained:  Additional history obtained from N/A External records from outside source obtained and reviewed including recent hospitalization   Co morbidities that complicate the patient evaluation  N/A  Social Determinants of Health:  No primary care provider    Lab Tests:  I Ordered, and personally interpreted labs.  The pertinent results include: N/A   Imaging Studies ordered:  I ordered imaging studies including x-ray of the left knee I independently visualized and interpreted imaging which showed negative acute findings I agree with the radiologist interpretation   Cardiac Monitoring:  The patient was maintained on a cardiac monitor.  I personally viewed and interpreted the cardiac monitored which showed an underlying rhythm of: N/A   Medicines ordered and prescription drug management:  I ordered medication including N/A I have reviewed the patients home medicines and have made adjustments as needed  Critical Interventions:  N/A   Reevaluation:  Presents with left knee pain, benign physical exam, agreement discharge at this time.  Consultations Obtained:  N/A    Test Considered:  Anticoag-with shared decision making this will be deferred, I find this reasonable as my suspicion for DVT is low at this time, I feel placing patient on prophylactic anticoag's risk outweigh the benefits especially since patient will be back  here tomorrow for a DVT study.    Rule out I have low suspicion for septic arthritis as patient denies IV drug use, skin exam was performed no erythematous, edematous, warm joints noted on exam, no new heart murmur heard on exam.  Low suspicion for fracture or dislocation as x-ray does not feel any significant findings.  Suspicion for limb ischemia is low as patient has 2+ dorsal pedal pulses, 2 send capillary refill, sensation tact light touch.  Low suspicion for compartment syndrome as area was palpated it was soft to the touch, neurovascular fully intact.  Suspicion for PE is low at this time not endorsing any pleuritic chest pain shortness of breath, presentation atypical etiology     Dispostion  and problem list  After consideration of the diagnostic results and the patients response to treatment, I feel that the patent would benefit from discharge.  Left-sided knee pain-unclear etiology, suspicion for DVT is low but cannot fully exclude, will send for DVT study, and follow-up with orthopedics if symptoms or not improving after 1 week's time.            Final Clinical Impression(s) / ED Diagnoses Final diagnoses:  Acute pain of left knee    Rx / DC Orders ED Discharge Orders          Ordered    LE Venous       Comments: IMPORTANT PATIENT INSTRUCTIONS:  You have been scheduled for an Outpatient Vascular Study at [facility].    If tomorrow is a Saturday, Sunday or holiday, please go to the [facility] Emergency Department Registration Desk at 11 am tomorrow morning and tell them you are there for a vascular study.   If tomorrow is a weekday (Monday-Friday), please go to [facility] Entrance C, [facility] Clinic Registration at 11 am and tell them you are there for a vascular study.   01/07/23 0019              Carroll Sage, PA-C 01/07/23 1610    Geoffery Lyons, MD 01/07/23 [phone]

## 2023-01-07 NOTE — Progress Notes (Signed)
Orthopedic Tech Progress Note Patient Details:  Jimmy Castro 10/02/1990 [id number]  Ortho Devices Type of Ortho Device: Knee Sleeve Ortho Device/Splint Location: lle Ortho Device/Splint Interventions: Ordered, Application, Adjustment   Post Interventions Patient Tolerated: Well Instructions Provided: Care of device, Adjustment of device  Trinna Post 01/07/2023, 7:26 AM

## 2023-01-07 NOTE — Discharge Instructions (Addendum)
I have scheduled you for a DVT study tomorrow, please review instruction above for your appointment.  In the meantime I have given you a brace please wear during the day you may take off at nighttime.  I recommend keeping your leg elevated when not in use, I recommend NSAIDs this can help with pain and swelling.  Please follow directions on back of bottle.  If your symptoms or not improving after a week's time I recommend follow-up with orthopedics for further evaluation   Come back to the emergency department if you develop chest pain, shortness of breath, severe abdominal pain, uncontrolled nausea, vomiting, diarrhea.

## 2023-01-15 ENCOUNTER — Other Ambulatory Visit: Payer: Self-pay

## 2023-01-15 ENCOUNTER — Encounter (HOSPITAL_COMMUNITY): Payer: Self-pay

## 2023-01-15 ENCOUNTER — Emergency Department (HOSPITAL_COMMUNITY)
Admission: EM | Admit: 2023-01-15 | Discharge: 2023-01-15 | Disposition: A | Discharge status: Home / Self Care | Payer: 59 | Attending: Emergency Medicine | Admitting: Emergency Medicine

## 2023-01-15 DIAGNOSIS — M25462 Effusion, left knee: Secondary | ICD-10-CM | POA: Diagnosis not present

## 2023-01-15 DIAGNOSIS — M25562 Pain in left knee: Secondary | ICD-10-CM | POA: Diagnosis present

## 2023-01-15 NOTE — ED Provider Notes (Signed)
[facility] AT [facility] Provider Note   CSN: [id number] Arrival date & time: 01/15/23  4098     History  Chief Complaint  Patient presents with   Knee Pain    Jimmy Castro is a 32 y.o. male.  32 year old male who presents to the emergency department with left knee pain.  Patient was seen on 01/06/2023 for left knee pain.  Was atraumatic.  Had x-rays at that time that did not show any acute abnormality.  Also had a DVT ultrasound that did not show any evidence of DVT since then.  Says that he has been having clicking and popping of his left knee and reports that occasionally gets locked up.  Also with mild swelling.  No additional injuries.  Denies any fevers.  Says that he is supposed to go back to work so wanted to be checked out again.  Denies any surgeries to the knee.  Does have to do significant amount of lifting and bending over for his work       Home Medications Prior to Admission medications   Medication Sig Start Date End Date Taking? Authorizing Provider  vitamin C (ASCORBIC ACID) 500 MG tablet Take 500 mg by mouth daily.   Yes [provider]  levETIRAcetam (KEPPRA) 500 MG tablet Take 1 tablet (500 mg total) by mouth 2 (two) times daily. Patient not taking: Reported on 01/15/2023 01/09/18 02/08/18  Cleora Fleet, MD  LORazepam (ATIVAN) 1 MG tablet On day one take 3 tablets by mouth, on day 2 take 2 tablets by mouth, on day 1 take 1 tablet by mouth then STOP Patient not taking: Reported on 01/15/2023 07/08/19   Zadie Rhine, MD      Allergies    Patient has no known allergies.    Review of Systems   Review of Systems  Physical Exam Updated Vital Signs BP (!) 140/94 (BP Location: Right Arm)   Pulse 91   Temp 98.6 F (37 C) (Oral)   Resp 18   Ht 5\' 7"  (1.702 m)   Wt 64.9 kg   SpO2 98%   BMI 22.40 kg/m  Physical Exam Vitals and nursing note reviewed.  Constitutional:      General: He is not in acute  distress.    Appearance: He is well-developed.  HENT:     Head: Normocephalic and atraumatic.     Right Ear: External ear normal.     Left Ear: External ear normal.     Nose: Nose normal.  Eyes:     Extraocular Movements: Extraocular movements intact.     Conjunctiva/sclera: Conjunctivae normal.     Pupils: Pupils are equal, round, and reactive to light.  Pulmonary:     Effort: Pulmonary effort is normal.  Abdominal:     Palpations: Abdomen is soft.  Musculoskeletal:     Cervical back: Normal range of motion and neck supple.     Right lower leg: No edema.     Left lower leg: No edema.     Comments: Still has full range of motion of left knee.  No significant effusion, erythema, or warmth of the left knee.  No significant movement on anterior draw or Lachman's test.  Skin:    General: Skin is warm and dry.  Neurological:     Mental Status: He is alert. Mental status is at baseline.  Psychiatric:        Mood and Affect: Mood normal.  Behavior: Behavior normal.     ED Results / Procedures / Treatments   Labs (all labs ordered are listed, but only abnormal results are displayed) Labs Reviewed - No data to display  EKG None  Radiology No results found.  Procedures Procedures   Medications Ordered in ED Medications - No data to display  ED Course/ Medical Decision Making/ A&P                             Medical Decision Making  Jimmy Castro is a 32 y.o. male who presents to the emergency department with left knee pain  Initial Ddx:  Meniscal tear, bursitis, knee sprain, fracture, inflammatory/infectious arthritis, DVT  MDM:  Feel the patient likely has a meniscal tear especially with the clicking and popping that he feels.  No significant joint laxity at this time to suggest a knee sprain.  Has already had x-rays so no evidence of fracture.  Also has had a DVT ruled out and no significant lower extremity swelling at this time that would indicate  that.  Plan:  Reassurance Work note Considered x-rays and DVT ultrasound but did not order since he has already had these  ED Summary/Re-evaluation:  While the patient follow-up with orthopedics or sports medicine for additional management.  May benefit from an MRI and surgical consultation since his symptoms are not improving.  Patient states that he already has a brace at home that he will wear.  This patient presents to the ED for concern of complaints listed in HPI, this involves an extensive number of treatment options, and is a complaint that carries with it a high risk of complications and morbidity. Disposition including potential need for admission considered.   Dispo: DC Home. Return precautions discussed including, but not limited to, those listed in the AVS. Allowed pt time to ask questions which were answered fully prior to dc.  Records reviewed Outpatient Clinic Notes I independently reviewed the following imaging with scope of interpretation limited to determining acute life threatening conditions related to emergency care: Extremity x-ray(s) and agree with the radiologist interpretation with the following exceptions: none I have reviewed the patients home medications and made adjustments as needed   Final Clinical Impression(s) / ED Diagnoses Final diagnoses:  Acute pain of left knee    Rx / DC Orders ED Discharge Orders     None         Rondel Baton, MD 01/15/23 [phone]

## 2023-01-15 NOTE — Discharge Instructions (Addendum)
You were seen for your knee pain in the emergency department. It may be due to a torn meniscus.   At home, please rest and ice your knee. Use tylenol and ibuprofen for the pain.    Check your MyChart online for the results of any tests that had not resulted by the time you left the emergency department.   Follow-up with an orthopedic doctor or sports medicine doctor as soon as possible.    Return immediately to the emergency department if you experience any of the following: fevers, joint redness, or any other concerning symptoms.    Thank you for visiting our Emergency Department. It was a pleasure taking care of you today.

## 2023-01-15 NOTE — ED Triage Notes (Signed)
Pt came in via POV d/t continued Lt knee pain. He was seen for this previously & has had this 8/10 pain in that knee with noted swelling for about a week & is here for re-eval. A/Ox4.

## 2023-01-16 ENCOUNTER — Telehealth: Payer: Self-pay | Admitting: Orthopedic Surgery

## 2023-01-16 NOTE — Telephone Encounter (Signed)
Spoke w/this patient this morning, he went to the ED at [facility] in [facility] yesterday for his left knee.  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  Your next available appointment marked for a new pt is out a bit, please advise if you want me to make him wait or place elsewhere.  [phone]

## 2023-01-16 NOTE — Telephone Encounter (Signed)
I SEE 430 OPEN TODAY

## 2023-01-22 ENCOUNTER — Ambulatory Visit: Payer: 59 | Admitting: Orthopaedic Surgery

## 2023-01-23 ENCOUNTER — Ambulatory Visit: Payer: 59 | Admitting: Orthopedic Surgery

## 2023-02-06 ENCOUNTER — Ambulatory Visit (INDEPENDENT_AMBULATORY_CARE_PROVIDER_SITE_OTHER): Payer: 59 | Admitting: Student

## 2023-02-06 ENCOUNTER — Encounter (HOSPITAL_BASED_OUTPATIENT_CLINIC_OR_DEPARTMENT_OTHER): Payer: Self-pay | Admitting: Student

## 2023-02-06 DIAGNOSIS — M25562 Pain in left knee: Secondary | ICD-10-CM

## 2023-02-06 NOTE — Progress Notes (Signed)
Chief Complaint: Left knee pain     History of Present Illness:    Jimmy Castro is a 32 y.o. male presenting today for evaluation of left knee pain.  He does not recall any specific injury.  This has been ongoing for about a month.  States that overall his pain levels have improved however he occasionally gets stiffness in the knee.  This has been significantly affecting his gait pattern.  Reports that the stiffness improves with movement and subsequently has zero to minimal symptoms while at work.  He has been seen in the ED twice within the last month for the left knee and workups were overall negative.  Does report that he has occasional popping in the knee.  Has been taking Advil as well as wearing a knee brace and icing.  He is here today for further evaluation and would like to get cleared to get back to work.    Surgical History:   None  PMH/PSH/Family History/Social History/Meds/Allergies:    Past Medical History:  Diagnosis Date   Alcohol abuse    Hypertension    History reviewed. No pertinent surgical history. Social History   Socioeconomic History   Marital status: Married    Spouse name: Not on file   Number of children: Not on file   Years of education: Not on file   Highest education level: Not on file  Occupational History   Not on file  Tobacco Use   Smoking status: Former    Packs/day: 1    Types: Cigarettes   Smokeless tobacco: Never  Vaping Use   Vaping Use: Never used  Substance and Sexual Activity   Alcohol use: Yes    Comment: occasional   Drug use: No   Sexual activity: Not on file  Other Topics Concern   Not on file  Social History Narrative   Not on file   Social Determinants of Health   Financial Resource Strain: Not on file  Food Insecurity: Not on file  Transportation Needs: Not on file  Physical Activity: Not on file  Stress: Not on file  Social Connections: Not on file   Family History   Problem Relation Age of Onset   Seizures Neg Hx    No Known Allergies Current Outpatient Medications  Medication Sig Dispense Refill   levETIRAcetam (KEPPRA) 500 MG tablet Take 1 tablet (500 mg total) by mouth 2 (two) times daily. (Patient not taking: Reported on 01/15/2023) 60 tablet 0   LORazepam (ATIVAN) 1 MG tablet On day one take 3 tablets by mouth, on day 2 take 2 tablets by mouth, on day 1 take 1 tablet by mouth then STOP (Patient not taking: Reported on 01/15/2023) 6 tablet 0   vitamin C (ASCORBIC ACID) 500 MG tablet Take 500 mg by mouth daily.     No current facility-administered medications for this visit.   No results found.  Review of Systems:   A ROS was performed including pertinent positives and negatives as documented in the HPI.  Physical Exam :   Constitutional: NAD and appears stated age Neurological: Alert and oriented Psych: Appropriate affect and cooperative There were no vitals taken for this visit.   Comprehensive Musculoskeletal Exam:    Patient is ambulating with a stiff knee gait.  Left knee is without  erythema or obvious deformity.  Mild soft tissue edema and no effusion appreciable.  Active range of motion 0 to 120 degrees.  5/5 strength with knee flexion and extension.  No laxity with varus or valgus stress.  Negative Lachman and negative McMurray.  Imaging:   Xray Review from 01/06/23 (Left knee 4 views): Negative   I personally reviewed and interpreted the radiographs.   Assessment:   32 y.o. male with ongoing atraumatic left knee pain.  Overall at this point he describes that the knee feels stiff however pain levels are mild.  X-rays are negative for any acute findings.  At this point I would recommend physical therapy for knee mobilization and strengthening.  Can continue current conservative modalities with ice, anti-inflammatories, and brace.  In the meantime I do think he should be able to resume normal work duties.  I would like to have him  return to clinic in about 5 weeks for reassessment to assess progress with physical therapy.  At that point can consider if further workup or evaluation is necessary.  Plan :    - Referral to physical therapy and return to clinic for reevaluation in 5 weeks     I personally saw and evaluated the patient, and participated in the management and treatment plan.  Hazle Nordmann, PA-C Orthopedics  This document was dictated using Conservation officer, historic buildings. A reasonable attempt at proof reading has been made to minimize errors.

## 2023-02-17 ENCOUNTER — Telehealth (HOSPITAL_BASED_OUTPATIENT_CLINIC_OR_DEPARTMENT_OTHER): Payer: Self-pay | Admitting: Student

## 2023-02-17 DIAGNOSIS — M25562 Pain in left knee: Secondary | ICD-10-CM

## 2023-02-17 NOTE — Telephone Encounter (Signed)
Referral placed. Lvm advising

## 2023-02-17 NOTE — Telephone Encounter (Signed)
Pt  LVM that he has not heard anything regarding his Physical Therapy. Please advise--I dont show a Referral to Therapy  Please call the pt back, they are will to call to have therapy set up   [phone]

## 2023-06-30 ENCOUNTER — Ambulatory Visit: Payer: 59 | Admitting: Family Medicine

## 2023-06-30 ENCOUNTER — Encounter: Payer: Self-pay | Admitting: Family Medicine

## 2023-06-30 VITALS — BP 137/93 | HR 101 | Temp 98.3°F | Resp 20 | Ht 67.0 in | Wt 131.0 lb

## 2023-06-30 DIAGNOSIS — I10 Essential (primary) hypertension: Secondary | ICD-10-CM | POA: Diagnosis not present

## 2023-06-30 DIAGNOSIS — F1010 Alcohol abuse, uncomplicated: Secondary | ICD-10-CM | POA: Diagnosis not present

## 2023-06-30 MED ORDER — HYDROXYZINE HCL 10 MG PO TABS: 10.0000 mg | ORAL_TABLET | Freq: Three times a day (TID) | ORAL | 0 refills | Status: AC | PRN

## 2023-06-30 NOTE — Assessment & Plan Note (Signed)
Recent detox at [facility] in October 2024. Currently drinking 2-24oz servings of alcohol per week. No current withdrawal symptoms. Patient has a sponsor and is attending weekly meetings at [facility]. -Continue current support and counseling at [facility]. -If withdrawal symptoms become intolerable, patient advised to go to the emergency department. -Prescribe Hydroxyzine for stress and mild withdrawal symptoms. -Update labs today

## 2023-06-30 NOTE — Progress Notes (Signed)
New Patient Office Visit  Subjective    Patient ID: Jimmy Castro, male    DOB: [birth year]  Age: 32 y.o. MRN: [id number]  CC:  Chief Complaint  Patient presents with   Establish Care    HPI Jimmy Castro presents to establish care   Discussed the use of AI scribe software for clinical note transcription with the patient, who gave verbal consent to proceed.  History of Present Illness   The patient, a Advertising copywriter, presents for a new patient consultation after a significant period without primary care. He has a history of alcohol use, high blood pressure, and a hernia repair in childhood. He recently completed a three-day inpatient detoxification program at [facility] in early October. Despite this, he admits to continued alcohol consumption, with an intake of two 24-ounce servings weekly, a reduction from daily use prior to the detox program. He denies current use of recreational drugs and reports smoking one cigar daily.  The patient has a history of seizures, which occurred when he attempted to quit alcohol 'cold [place]' approximately three years ago. He was treated with Keppra and later Tegretol, but has been off these medications for at least two years and has not had a seizure in this time.  He reports taking a B complex multivitamin and denies any known medication allergies. He has a family history of testicular cancer in his father, diagnosed in his early thirties, and high blood pressure in his mother.  The patient is currently living with his family but plans to move in with his fiance and her son. He has two sons from a previous relationship who live with his ex-partner. He is currently engaged with [facility] for ongoing support with his alcohol use and has a sponsor. He denies any significant anxiety or depression, attributing any stress to general life circumstances.             06/30/2023    3:31 PM 06/30/2023    3:16 PM  PHQ9 SCORE ONLY  PHQ-9 Total  Score 5 0      06/30/2023    3:32 PM  GAD 7 : Generalized Anxiety Score  Nervous, Anxious, on Edge 1  Control/stop worrying 1  Worry too much - different things 1  Trouble relaxing 1  Restless 0  Easily annoyed or irritable 1  Afraid - awful might happen 0  Total GAD 7 Score 5  Anxiety Difficulty Not difficult at all       Outpatient Encounter Medications as of 06/30/2023  Medication Sig   hydrOXYzine (ATARAX) 10 MG tablet Take 1-2 tablets (10-20 mg total) by mouth 3 (three) times daily as needed.   LORazepam (ATIVAN) 1 MG tablet On day one take 3 tablets by mouth, on day 2 take 2 tablets by mouth, on day 1 take 1 tablet by mouth then STOP   [DISCONTINUED] vitamin C (ASCORBIC ACID) 500 MG tablet Take 500 mg by mouth daily.   levETIRAcetam (KEPPRA) 500 MG tablet Take 1 tablet (500 mg total) by mouth 2 (two) times daily. (Patient not taking: Reported on 01/15/2023)   No facility-administered encounter medications on file as of 06/30/2023.    Past Medical History:  Diagnosis Date   Alcohol abuse    Hypertension     Past Surgical History:  Procedure Laterality Date   INGUINAL HERNIA REPAIR     age 18    Family History  Problem Relation Age of Onset   Hypertension Mother    Cancer  Father 64       testicular   Seizures Neg Hx     Social History   Socioeconomic History   Marital status: Married    Spouse name: Not on file   Number of children: Not on file   Years of education: Not on file   Highest education level: Not on file  Occupational History   Not on file  Tobacco Use   Smoking status: Former    Types: Cigars   Smokeless tobacco: Never  Vaping Use   Vaping status: Never Used  Substance and Sexual Activity   Alcohol use: Not Currently    Alcohol/week: 2.0 standard drinks of alcohol    Types: 2 Standard drinks or equivalent per week    Comment: occasional ,last drink yesterday   Drug use: No   Sexual activity: Yes    Partners: Female  Other Topics  Concern   Not on file  Social History Narrative   Not on file   Social Determinants of Health   Financial Resource Strain: Not on file  Food Insecurity: Not on file  Transportation Needs: Not on file  Physical Activity: Not on file  Stress: Not on file  Social Connections: Not on file  Intimate Partner Violence: Not on file    ROS All review of systems negative except what is listed in the HPI      Objective    BP (!) 137/93 (BP Location: Left Arm, Patient Position: Sitting, Cuff Size: Normal)   Pulse (!) 101   Temp 98.3 F (36.8 C) (Oral)   Resp 20   Ht 5\' 7"  (1.702 m)   Wt 131 lb (59.4 kg)   SpO2 100%   BMI 20.52 kg/m   Physical Exam Vitals reviewed.  Constitutional:      Appearance: Normal appearance.  Cardiovascular:     Rate and Rhythm: Normal rate and regular rhythm.     Heart sounds: Normal heart sounds.  Pulmonary:     Effort: Pulmonary effort is normal.     Breath sounds: Normal breath sounds.  Skin:    General: Skin is warm and dry.  Neurological:     Mental Status: He is alert and oriented to person, place, and time.     Cranial Nerves: No cranial nerve deficit.     Motor: No weakness.     Coordination: Coordination normal.  Psychiatric:        Mood and Affect: Mood normal.        Behavior: Behavior normal.        Thought Content: Thought content normal.        Judgment: Judgment normal.             Assessment & Plan:   Problem List Items Addressed This Visit       Active Problems   Hypertension - Primary    Blood pressure is not at goal for age and co-morbidities.   Recommendations: monitor at home, follow-up if consistently above goal - BP goal <130/80 - monitor and log blood pressures at home - check around the same time each day in a relaxed setting - Limit salt to <2000 mg/day - Follow DASH eating plan (heart healthy diet) - limit alcohol to 2 standard drinks per day for men and 1 per day for women - avoid tobacco  products - get at least 2 hours of regular aerobic exercise weekly Patient aware of signs/symptoms requiring further/urgent evaluation. Labs updated today.  Relevant Orders   Comprehensive metabolic panel   CBC with Differential/Platelet   Lipid panel   TSH   Alcohol abuse    Recent detox at [facility] in October 2024. Currently drinking 2-24oz servings of alcohol per week. No current withdrawal symptoms. Patient has a sponsor and is attending weekly meetings at [facility]. -Continue current support and counseling at [facility]. -If withdrawal symptoms become intolerable, patient advised to go to the emergency department. -Prescribe Hydroxyzine for stress and mild withdrawal symptoms. -Update labs today       Relevant Medications   hydrOXYzine (ATARAX) 10 MG tablet   Other Relevant Orders   B12 and Folate Panel   Comprehensive metabolic panel   CBC with Differential/Platelet    Return if symptoms worsen or fail to improve, for ; routine follow-up/CPE 6 months.   Clayborne Dana, NP

## 2023-06-30 NOTE — Patient Instructions (Signed)
Thank you for choosing [facility] at [facility] for your Primary Castro needs. I am excited for the opportunity to partner with you to meet your health Castro goals. It was a pleasure meeting you today!  Information on diet, exercise, and health maintenance recommendations are listed below. This is information to help you be sure you are on track for optimal health and monitoring.   Please look over this and let us know if you have any questions or if you have completed any of the health maintenance outside of [place] so that we can be sure your records are up to date.  ___________________________________________________________  MyChart:  For all urgent or time sensitive needs we ask that you please call the office to avoid delays. Our number is [phone]. MyChart is not constantly monitored and due to the large volume of messages a day, replies may take up to 72 business hours.  MyChart Policy: MyChart allows for you to see your visit notes, after visit summary, provider recommendations, lab and tests results, make an appointment, request refills, and contact your provider or the office for non-urgent questions or concerns. Providers are seeing patients during normal business hours and do not have built in time to review MyChart messages.  We ask that you allow a minimum of 3 business days for responses to MyChart messages. For this reason, please do not send urgent requests through MyChart. Please call the office at [phone]. New and ongoing conditions may require a visit. We have virtual and in-person visits available for your convenience.  Complex MyChart concerns may require a visit. Your provider may request you schedule a virtual or in-person visit to ensure we are providing the best Castro possible. MyChart messages sent after 11:00 AM on Friday will not be received by the provider until Monday morning.    Lab and Test Results: You will receive your lab and test  results on MyChart as soon as they are completed and results have been sent by the lab or testing facility. Due to this service, you will receive your results BEFORE your provider.  I review lab and test results each morning prior to seeing patients. Some results require collaboration with other providers to ensure you are receiving the most appropriate Castro. For this reason, we ask that you please allow a minimum of 3-5 business days from the time that ALL results have been received for your provider to receive and review lab and test results and contact you about these.  Most lab and test result comments from the provider will be sent through MyChart. Your provider may recommend changes to the plan of Castro, follow-up visits, repeat testing, ask questions, or request an office visit to discuss these results. You may reply directly to this message or call the office to provide information for the provider or set up an appointment. In some instances, you will be called with test results and recommendations. Please let us know if this is preferred and we will make note of this in your chart to provide this for you.    If you have not heard a response to your lab or test results in 5 business days from all results returning to MyChart, please call the office to let us know. We ask that you please avoid calling prior to this time unless there is an emergent concern. Due to high call volumes, this can delay the resulting process.  After Hours: For all non-emergency after hours needs, please   call the office at [phone] and select the option to reach the on-call  service. On-call services are shared between multiple [facility] offices and therefore it will not be possible to speak directly with your provider. On-call providers may provide medical advice and recommendations, but are unable to provide refills for maintenance medications.  For all emergency or urgent medical needs after normal business hours, we  recommend that you seek Castro at the closest Urgent Castro or Emergency Department to ensure appropriate treatment in a timely manner.  [facility] has a 24 hour emergency room located on the ground floor for your convenience.   Urgent Concerns During the Business Day Providers are seeing patients from 8AM to 5PM with a busy schedule and are most often not able to respond to non-urgent calls until the end of the day or the next business day. If you should have URGENT concerns during the day, please call and speak to the nurse or schedule a same day appointment so that we can address your concern without delay.   Thank you, again, for choosing me as your health Castro partner. I appreciate your trust and look forward to learning more about you!   Darcey Demma B. Nhi Butrum, DNP, FNP-C  ___________________________________________________________  Health Maintenance Recommendations Screening Testing Mammogram Every 1-2 years based on history and risk factors Starting at age 50 Pap Smear Ages 21-39 every 3 years Ages 30-65 every 5 years with HPV testing More frequent testing may be required based on results and history Colon Cancer Screening Every 1-10 years based on test performed, risk factors, and history Starting at age 45 Bone Density Screening Every 2-10 years based on history Starting at age 65 for women Recommendations for men differ based on medication usage, history, and risk factors AAA Screening One time ultrasound Men 65-75 years old who have ever smoked Lung Cancer Screening Low Dose Lung CT every 12 months Age 50-80 years with a 20 pack-year smoking history who still smoke or who have quit within the last 15 years  Screening Labs Routine  Labs: Complete Blood Count (CBC), Complete Metabolic Panel (CMP), Cholesterol (Lipid Panel) Every 6-12 months based on history and medications May be recommended more frequently based on current conditions or previous results Hemoglobin  A1c Lab Every 3-12 months based on history and previous results Starting at age 45 or earlier with diagnosis of diabetes, high cholesterol, BMI >26, and/or risk factors Frequent monitoring for patients with diabetes to ensure blood sugar control Thyroid Panel  Every 6 months based on history, symptoms, and risk factors May be repeated more often if on medication HIV One time testing for all patients 13 and older May be repeated more frequently for patients with increased risk factors or exposure Hepatitis C One time testing for all patients 18 and older May be repeated more frequently for patients with increased risk factors or exposure Gonorrhea, Chlamydia Every 12 months for all sexually active persons 13-24 years Additional monitoring may be recommended for those who are considered high risk or who have symptoms PSA Men 40-54 years old with risk factors Additional screening may be recommended from age 55-69 based on risk factors, symptoms, and history  Vaccine Recommendations Tetanus Booster All adults every 10 years Flu Vaccine All patients 6 months and older every year COVID Vaccine All patients 12 years and older Initial dosing with booster May recommend additional booster based on age and health history HPV Vaccine 2 doses all patients age 9-26 Dosing may be considered   for patients over 26 Shingles Vaccine (Shingrix) 2 doses all adults 50 years and older Pneumonia (Pneumovax 23) All adults 65 years and older May recommend earlier dosing based on health history Pneumonia (Prevnar 13) All adults 65 years and older Dosed 1 year after Pneumovax 23 Pneumonia (Prevnar 20) All adults 65 years and older (adults 19-64 with certain conditions or risk factors) 1 dose  For those who have not received Prevnar 13 vaccine previously   Additional Screening, Testing, and Vaccinations may be recommended on an individualized basis based on family history, health history, risk  factors, and/or exposure.  __________________________________________________________  Diet Recommendations for All Patients  I recommend that all patients maintain a diet low in saturated fats, carbohydrates, and cholesterol. While this can be challenging at first, it is not impossible and small changes can make big differences.  Things to try: Decreasing the amount of soda, sweet tea, and/or juice to one or less per day and replace with water While water is always the first choice, if you do not like water you may consider adding a water additive without sugar to improve the taste other sugar free drinks Replace potatoes with a brightly colored vegetable  Use healthy oils, such as canola oil or olive oil, instead of butter or hard margarine Limit your bread intake to two pieces or less a day Replace regular pasta with low carb pasta options Bake, broil, or grill foods instead of frying Monitor portion sizes  Eat smaller, more frequent meals throughout the day instead of large meals  An important thing to remember is, if you love foods that are not great for your health, you don't have to give them up completely. Instead, allow these foods to be a reward when you have done well. Allowing yourself to still have special treats every once in a while is a nice way to tell yourself thank you for working hard to keep yourself healthy.   Also remember that every day is a new day. If you have a bad day and "fall off the wagon", you can still climb right back up and keep moving along on your journey!  We have resources available to help you!  Some websites that may be helpful include: www.MyPlate.gov  Www.VeryWellFit.com _____________________________________________________________  Activity Recommendations for All Patients  I recommend that all adults get at least 20 minutes of moderate physical activity that elevates your heart rate at least 5 days out of the week.  Some examples  include: Walking or jogging at a pace that allows you to carry on a conversation Cycling (stationary bike or outdoors) Water aerobics Yoga Weight lifting Dancing If physical limitations prevent you from putting stress on your joints, exercise in a pool or seated in a chair are excellent options.  Do determine your MAXIMUM heart rate for activity: 220 - YOUR AGE = MAX Heart Rate   Remember! Do not push yourself too hard.  Start slowly and build up your pace, speed, weight, time in exercise, etc.  Allow your body to rest between exercise and get good sleep. You will need more water than normal when you are exerting yourself. Do not wait until you are thirsty to drink. Drink with a purpose of getting in at least 8, 8 ounce glasses of water a day plus more depending on how much you exercise and sweat.    If you begin to develop dizziness, chest pain, abdominal pain, jaw pain, shortness of breath, headache, vision changes, lightheadedness, or other concerning symptoms,   stop the activity and allow your body to rest. If your symptoms are severe, seek emergency evaluation immediately. If your symptoms are concerning, but not severe, please let us know so that we can recommend further evaluation.     

## 2023-06-30 NOTE — Assessment & Plan Note (Signed)
Blood pressure is not at goal for age and co-morbidities.   Recommendations: monitor at home, follow-up if consistently above goal - BP goal <130/80 - monitor and log blood pressures at home - check around the same time each day in a relaxed setting - Limit salt to <2000 mg/day - Follow DASH eating plan (heart healthy diet) - limit alcohol to 2 standard drinks per day for men and 1 per day for women - avoid tobacco products - get at least 2 hours of regular aerobic exercise weekly Patient aware of signs/symptoms requiring further/urgent evaluation. Labs updated today.

## 2023-07-01 LAB — LIPID PANEL
Cholesterol: 117 mg/dL (ref 0–200)
HDL: 53.4 mg/dL (ref >39.00)
LDL Cholesterol: 50 mg/dL (ref 0–99)
NonHDL: 63.31
Total CHOL/HDL Ratio: 2
Triglycerides: 65 mg/dL (ref 0.0–149.0)
VLDL: 13 mg/dL (ref 0.0–40.0)

## 2023-07-01 LAB — COMPREHENSIVE METABOLIC PANEL
ALT: 13 U/L (ref 0–53)
AST: 17 U/L (ref 0–37)
Albumin: 4.2 g/dL (ref 3.5–5.2)
Alkaline Phosphatase: 79 U/L (ref 39–117)
BUN: 5 mg/dL — ABNORMAL LOW (ref 6–23)
CO2: 25 meq/L (ref 19–32)
Calcium: 9.5 mg/dL (ref 8.4–10.5)
Chloride: 105 meq/L (ref 96–112)
Creatinine, Ser: 0.63 mg/dL (ref 0.40–1.50)
GFR: 126.17 mL/min (ref >60.00)
Glucose, Bld: 85 mg/dL (ref 70–99)
Potassium: 3.6 meq/L (ref 3.5–5.1)
Sodium: 141 meq/L (ref 135–145)
Total Bilirubin: 0.3 mg/dL (ref 0.2–1.2)
Total Protein: 7 g/dL (ref 6.0–8.3)

## 2023-07-01 LAB — CBC WITH DIFFERENTIAL/PLATELET
Basophils Absolute: 0.1 10*3/uL (ref 0.0–0.1)
Basophils Relative: 1.3 % (ref 0.0–3.0)
Eosinophils Absolute: 0 10*3/uL (ref 0.0–0.7)
Eosinophils Relative: 0.2 % (ref 0.0–5.0)
HCT: 42.6 % (ref 39.0–52.0)
Hemoglobin: 14.5 g/dL (ref 13.0–17.0)
Lymphocytes Relative: 18.8 % (ref 12.0–46.0)
Lymphs Abs: 1.8 10*3/uL (ref 0.7–4.0)
MCHC: 34.1 g/dL (ref 30.0–36.0)
MCV: 92.3 fL (ref 78.0–100.0)
Monocytes Absolute: 0.8 10*3/uL (ref 0.1–1.0)
Monocytes Relative: 8.1 % (ref 3.0–12.0)
Neutro Abs: 6.9 10*3/uL (ref 1.4–7.7)
Neutrophils Relative %: 71.6 % (ref 43.0–77.0)
Platelets: 366 10*3/uL (ref 150.0–400.0)
RBC: 4.61 Mil/uL (ref 4.22–5.81)
RDW: 15.1 % (ref 11.5–15.5)
WBC: 9.7 10*3/uL (ref 4.0–10.5)

## 2023-07-01 LAB — TSH: TSH: 1.02 u[IU]/mL (ref 0.35–5.50)

## 2023-07-01 LAB — B12 AND FOLATE PANEL
Folate: 10.6 ng/mL (ref >5.9)
Vitamin B-12: 413 pg/mL (ref 211–911)

## 2023-07-15 ENCOUNTER — Ambulatory Visit: Payer: 59 | Admitting: Family Medicine

## 2023-07-29 ENCOUNTER — Ambulatory Visit (HOSPITAL_BASED_OUTPATIENT_CLINIC_OR_DEPARTMENT_OTHER): Payer: 59 | Attending: Physical Therapy | Admitting: Physical Therapy
